# Patient Record
Sex: Female | Born: 1975 | Hispanic: No | Marital: Married | Smoking: Never smoker
Health system: Southern US, Community
[De-identification: ages and names within clinical notes are randomized; demographics above are authoritative.]

## PROBLEM LIST (undated history)

## (undated) DIAGNOSIS — R002 Palpitations: Principal | ICD-10-CM

## (undated) DIAGNOSIS — F419 Anxiety disorder, unspecified: Secondary | ICD-10-CM

## (undated) DIAGNOSIS — I341 Nonrheumatic mitral (valve) prolapse: Secondary | ICD-10-CM

## (undated) DIAGNOSIS — G373 Acute transverse myelitis in demyelinating disease of central nervous system: Secondary | ICD-10-CM

## (undated) HISTORY — DX: Palpitations: R00.2

## (undated) HISTORY — DX: Anxiety disorder, unspecified: F41.9

## (undated) HISTORY — PX: TONSILLECTOMY AND ADENOIDECTOMY: SHX28

## (undated) HISTORY — DX: Nonrheumatic mitral (valve) prolapse: I34.1

## (undated) HISTORY — DX: Acute transverse myelitis in demyelinating disease of central nervous system: G37.3

---

## 2005-02-09 ENCOUNTER — Other Ambulatory Visit: Admission: RE | Admit: 2005-02-09 | Discharge: 2005-02-09 | Payer: Self-pay | Admitting: Obstetrics and Gynecology

## 2006-03-08 ENCOUNTER — Other Ambulatory Visit: Admission: RE | Admit: 2006-03-08 | Discharge: 2006-03-08 | Payer: Self-pay | Admitting: Obstetrics and Gynecology

## 2007-04-30 ENCOUNTER — Other Ambulatory Visit: Admission: RE | Admit: 2007-04-30 | Discharge: 2007-04-30 | Payer: Self-pay | Admitting: Obstetrics and Gynecology

## 2008-07-07 ENCOUNTER — Other Ambulatory Visit: Admission: RE | Admit: 2008-07-07 | Discharge: 2008-07-07 | Payer: Self-pay | Admitting: Obstetrics and Gynecology

## 2010-11-08 ENCOUNTER — Other Ambulatory Visit: Payer: Self-pay | Admitting: Obstetrics and Gynecology

## 2010-11-08 ENCOUNTER — Other Ambulatory Visit: Payer: Self-pay | Admitting: Nurse Practitioner

## 2010-11-08 ENCOUNTER — Other Ambulatory Visit (HOSPITAL_COMMUNITY)
Admission: RE | Admit: 2010-11-08 | Discharge: 2010-11-08 | Disposition: A | Discharge status: Home / Self Care | Payer: 59 | Source: Ambulatory Visit | Attending: Obstetrics and Gynecology | Admitting: Obstetrics and Gynecology

## 2010-11-08 DIAGNOSIS — N83201 Unspecified ovarian cyst, right side: Secondary | ICD-10-CM

## 2010-11-08 DIAGNOSIS — Z1159 Encounter for screening for other viral diseases: Secondary | ICD-10-CM | POA: Insufficient documentation

## 2010-11-08 DIAGNOSIS — Z01419 Encounter for gynecological examination (general) (routine) without abnormal findings: Secondary | ICD-10-CM | POA: Insufficient documentation

## 2010-11-12 ENCOUNTER — Other Ambulatory Visit: Payer: Self-pay | Admitting: Obstetrics and Gynecology

## 2010-11-12 ENCOUNTER — Ambulatory Visit
Admission: RE | Admit: 2010-11-12 | Discharge: 2010-11-12 | Disposition: A | Discharge status: Home / Self Care | Payer: 59 | Source: Ambulatory Visit | Attending: Obstetrics and Gynecology | Admitting: Obstetrics and Gynecology

## 2010-11-12 DIAGNOSIS — N838 Other noninflammatory disorders of ovary, fallopian tube and broad ligament: Secondary | ICD-10-CM

## 2010-11-12 DIAGNOSIS — N83201 Unspecified ovarian cyst, right side: Secondary | ICD-10-CM

## 2010-11-12 MED ORDER — GADOBENATE DIMEGLUMINE 529 MG/ML IV SOLN
10.0000 mL | Freq: Once | INTRAVENOUS | Status: AC | PRN
  Administered 2010-11-12: 10 mL via INTRAVENOUS

## 2011-01-26 ENCOUNTER — Ambulatory Visit: Payer: Self-pay | Admitting: Gynecology

## 2011-02-06 ENCOUNTER — Encounter: Payer: Self-pay | Admitting: Gynecology

## 2011-02-06 ENCOUNTER — Ambulatory Visit (INDEPENDENT_AMBULATORY_CARE_PROVIDER_SITE_OTHER): Payer: BC Managed Care – PPO | Admitting: Gynecology

## 2011-02-06 ENCOUNTER — Ambulatory Visit (INDEPENDENT_AMBULATORY_CARE_PROVIDER_SITE_OTHER): Payer: BC Managed Care – PPO

## 2011-02-06 VITALS — BP 118/78 | Ht 66.75 in | Wt 136.0 lb

## 2011-02-06 DIAGNOSIS — N83209 Unspecified ovarian cyst, unspecified side: Secondary | ICD-10-CM

## 2011-02-06 DIAGNOSIS — N949 Unspecified condition associated with female genital organs and menstrual cycle: Secondary | ICD-10-CM

## 2011-02-06 DIAGNOSIS — Z309 Encounter for contraceptive management, unspecified: Secondary | ICD-10-CM

## 2011-02-06 DIAGNOSIS — N938 Other specified abnormal uterine and vaginal bleeding: Secondary | ICD-10-CM

## 2011-02-06 DIAGNOSIS — N831 Corpus luteum cyst of ovary, unspecified side: Secondary | ICD-10-CM

## 2011-02-06 LAB — POCT URINE PREGNANCY: Preg Test, Ur: NEGATIVE

## 2011-02-06 NOTE — Progress Notes (Signed)
Addended by: Cammie Mcgee T on: 02/06/2011 04:36 PM   Modules accepted: Orders

## 2011-02-06 NOTE — Patient Instructions (Signed)
Management of unscheduled bleeding in women using contraception  Authors Leland Her, MD, MPH Lonia Blood, MD, MPH Section Editor Enos Fling, MD Deputy Editor Raye Sorrow, MD Disclosures  All topics are updated as new evidence becomes available and our peer review process is complete.  Literature review current through: Nov 2012.  This topic last updated: Nov 28, 2010.  INTRODUCTION - A common reason women choose to discontinue hormonal contraception is dissatisfaction with its effects on uterine bleeding [1,2]. The bleeding pattern most bothersome to women is probably unscheduled bleeding and spotting, which can occur with all hormonal methods of contraception, as well as the copper intrauterine device. The frequency of unscheduled bleeding is highest in the first few months of use, and then begins to diminish. Interventions that prevent or treat unscheduled bleeding could improve contraceptive acceptability and increase compliance, and thus lead to fewer unplanned pregnancies. These interventions are discussed below and summarized in the figure (figure 1). DEFINITIONS - The following terminology has been suggested for standardizing the description of bleeding in women in contraceptive trials [3]: Bleeding - Blood loss that requires the use of a tampon, pad or panty liner.  Spotting - Minimal blood loss that does not require use of any type of protection.  Episode of bleeding/spotting - Bleeding/spotting days bounded on either end by two days of no bleeding or spotting.  Scheduled bleeding or withdrawal bleeding - Any bleeding or spotting that occurs during contraceptive hormone-free intervals; bleeding may continue through days 1 to 4 of the subsequent active cycle.  Unscheduled bleeding and unscheduled spotting - Any bleeding that occurs while taking active contraceptive hormones, except bleeding that begins in the hormone-free interval and continues through days 1 to 4 of the  subsequent active cycle. MECHANISM - The pathogenesis of unscheduled bleeding in women using hormonal contraception is poorly understood. Upon initiation of the method, unscheduled bleeding is thought to be due to a relatively thick endometrium transitioning to a relatively thin endometrium as a result of the progestin-dominant component of all hormonal contraceptives [1]. With continuing use, it is thought to be the final step in a complex process activated by continuous exposure to exogenous sex steroids, particularly progestins [4]. In this environment, the endometrium develops a dense network of small, thin-walled, dilated, superficial veins and capillaries, which are fragile and prone to focal bleeding. The fragility of these vessels is due to alterations in their basement membranes and pericytes, as well as reduced structural support from the endometrial stroma and glands, which are also altered [5,6]. Altered matrix metalloproteinase (MMP) acitivity appears to have a major role in this remodeling. Changes in endometrial perfusion, local vascular hemostasis, pro- and anti-oxidant processes, and migratory cells also appear to play a role. GENERAL APPROACH - Prior to initiating contraception, women should be thoroughly counseled about the range of bleeding patterns associated with various contraceptive options and informed that there is no evidence that unscheduled bleeding is associated with decreased contraceptive efficacy or other harmful effects. Providing this information as a component of standard contraception counseling helps patients choose the method that best suits their needs. If warned of the frequency, course, and significance of unscheduled bleeding, they may be more willing to adhere to the method if bleeding irregularities occur and can be reassured that the method is effective, thus reducing the risk of unintended pregnancy. Users of short-acting hormonal methods like oral contraceptive  pills, the contraceptive patch, or the vaginal ring should understand that missed or delayed ingestion, application, or placement can  lead to unscheduled withdrawal bleeding. Even with the most effective contraceptive methods, pregnancy should be considered and excluded if a woman experiences a significant change in her bleeding pattern or if she develops pregnancy-type symptoms (breast tenderness, nausea, urinary frequency, fatigue). Both pregnancy (intrauterine and ectopic) and hormonal contraceptives share many of the same signs and symptoms. In particular, absence of uterine bleeding and unscheduled bleeding are not only signs of early pregnancy, but also common occurrences with hormonal methods of birth control, both combined and progestin-only. In addition to a full medical history, the following questions can help guide further evaluation when a woman is experiencing unscheduled bleeding or spotting: What are the patient's main concerns?  What was her bleeding pattern prior to her current method of contraception? How has it changed?  How many days does she bleed each month, how heavy is the bleeding, and how many bleeding episodes occur?  Does the bleeding occur during or after sex or is it associated with pain or urinary symptoms? Bleeding associated with sex, pain, or urinary symptoms is unlikely to be related to contraception.  What drugs or medications is she taking? Some drugs may affect bleeding patterns in contraceptive users [7].  Has she been consistent in her use of contraception? Missing pills or taking them late affects hormone levels, which can cause unscheduled bleeding.  Does she smoke? Smoking may affect bleeding patterns in contraceptive users [8].  Has she had a new partner or is she at increased risk of a sexually transmitted infection? Cervicitis can cause unscheduled bleeding. (See "Acute cervicitis".)  When was her last cervical cancer screening? Beyond pregnancy testing, the  decision to initiate a more detailed evaluation will depend on the answers to these questions and the type of contraceptive the patient is using. Unscheduled bleeding is common and expected with the use of progestin methods (contraceptive implants, progestin only pills, DMPA, and the levonorgestrel-releasing IUD); further evaluation is not necessary unless indicated based on history (bleeding pattern was abnormal prior to contraceptive use), symptoms (menorrhagia, pain, vaginal discharge) or national cervical cancer screening guidelines, even if the unscheduled bleeding persists for the duration of contraceptive use. With the initiation of other contraceptive methods, including combined estrogen-progestin methods (pill, patch, ring) and the copper-releasing IUD, unscheduled bleeding is common and usually resolves over time, thus, further evaluation is not routinely necessary in the first six months of use. A patient with additional symptoms, such as pelvic pain or vaginal discharge, should have a pelvic examination. Depending on the findings and her symptoms, a pelvic ultrasound may be helpful to evaluate for possible uterine pathology. Cervical cancer screening should be performed according to national screening guidelines. (See "Screening for cervical cancer: Rationale and recommendations".) An endometrial biopsy is reasonable in women over the age of 69 who had abnormal uterine bleeding for more than three months prior to starting contraception, women with a history of endometrial hyperplasia, or women with prolonged periods of unopposed estrogen stimulation secondary to chronic anovulation. Otherwise, the decision to biopsy the endometrium for women who are experiencing unscheduled bleeding while using contraception will depend on the method they are using. (See "Initial approach to the premenopausal woman with abnormal uterine bleeding".) Drug therapy has been used in an attempt to prevent or treat  unscheduled bleeding. Most trials have been small and/or had design flaws; thus, it is difficult to determine whether the medical therapies discussed below result in a statistically significant reduction in symptoms. ESTROGEN-PROGESTIN CONTRACEPTIVES Bleeding pattern - Combined estrogen-progestin contraceptives include oral  contraceptive pills (OCs), the transdermal contraceptive patch, and the vaginal contraceptive ring. An injectable combined estrogen-progestin contraceptive (Cyclofemina, Lunelle) is available in some countries. Estrogen-progestin contraceptive pills - Unscheduled bleeding occurs in up to 30 percent of women initiating OCs, but decreases to less than 10 percent by the third month of use [1,9]. Randomized trials have shown that unscheduled bleeding is slightly higher with the lowest dose OCs (20 mcg ethinyl estradiol [EE] component) than with 30 to 35 mcg EE pills [10,11]. There is no evidence that unscheduled bleeding is associated with decreased efficacy, even with the lowest dose products, as long as the woman takes her pills consistently (ie, no missed days and at the same time every day) [1]. (See "Overview of the use of estrogen-progestin contraceptives".)  Continuous and extended administration of OCs to avoid all scheduled uterine bleeding is an increasingly popular method of contraception. Continuous use involves taking hormonally active pills indefinitely, without an induced withdrawal bleed. Extended use involves taking hormonally active pills for intervals of several months, thus minimizing scheduled bleeds to only a few times per year.  Compared to the traditional monthly regimen, continuous and extended use of OCs decreases the overall number of scheduled bleeding days; however, they are associated with a high frequency of unscheduled bleeding and spotting, particularly during the first three months of use [12-15]. In randomized trials, absence of all uterine bleeding and  spotting during months 0 to 3 was achieved in less than 50 percent of continuous OC users, but this rate increased to 80 to 90 percent by months 10 to 12 [12-14]. The duration of unscheduled bleeding appears to be higher in women using continuous OCs that contain levonorgestrel (LNG) than in those that contain norethindrone acetate [16]. (See "Hormonal contraception for suppression of menstruation".)  Patch and ring - The frequency of unscheduled bleeding upon initiation of the contraceptive patch and ring is generally similar to that with OCs. Neither is approved for extended or continuous use. Although the ring and patch are not approved for continuous/extended use, a randomized trial comparing extended versus cyclic vaginal ring regimens found that skipping the hormone-free interval increased unscheduled bleeding and reduced the amount of scheduled/withdrawal bleeding [17]. (See "Transdermal contraceptive patch".) Management - For women experiencing unscheduled bleeding while taking combined estrogen-progestin contraceptives (pill, patch, ring), reinforcing consistent use (reducing the number of missed doses, taking the pill at the same time each day) can improve the bleeding pattern [9,18]. Inconsistent pill use is associated with a 60 to 70 percent increase in the relative risk of unscheduled bleeding [18]. Smokers are more likely than nonsmokers to experience unscheduled bleeding and spotting when taking OCs [9,19]. Bleeding is also more likely to persist through subsequent cycles [9]. Smoking cessation may result in improved cycle control, and has other major health benefits [1]. Women with new or persistent bleeding for six months should be evaluated for cervicitis and other gynecological disorders associated with bleeding (eg, cervical or endometrial polyps) [9,20,21]. (See "Acute cervicitis" and "Endometrial polyps".) Women using a cyclic estrogen-progestin regimen - As with other methods, reassurance  and encouragement is the best long-term management for unscheduled bleeding associated with OC use. Unscheduled bleeding will cease by the third cycle in the majority of women [1]. If bleeding is prolonged and the woman desires intervention, experts have recommended a short course of estrogen (1.25 mg conjugated estrogen or 2 mg estradiol for seven days) to decrease the duration of the bleeding episode [1]. Supplemental estrogen can be used any time during  the pill cycle. Estrogen supplementation is thought to promote tissue repair and coagulation. However, there are no studies demonstrating the benefit of this approach. Some clinicians manage bothersome unscheduled bleeding related to OCs by switching the woman to the estrogen-progestin vaginal ring. Theoretically, this approach has two potential benefits: (1) the woman is less likely to have unscheduled bleeding related to a "missed pill," and (2) the vaginal ring is associated with more constant hormone levels than OCs. Although there are no studies evaluating this approach, the resulting elimination of wide swings in serum estrogen and progestin concentrations may reduce bothersome unscheduled bleeding. The ring has excellent cycle control under normal circumstances; however, this may not be the case in a woman experiencing unscheduled bleeding. There is no evidence that monophasic OCs are associated with less unscheduled bleeding than biphasic OCs, or that discontinuation due to dissatisfaction with bleeding patterns is different for the two formulations [9,22]. Some studies have found that triphasic OCs have a lower incidence of unscheduled bleeding than monophasic OCs [23-26]. There is also no evidence that pill products by different manufacturers or different formulations of estrogen or progestin result in improved bleeding patterns [9]. Although uncontrolled studies have reported decreased bleeding after changing formulations, bleeding would have  probably stopped with continued use of the initial OC regardless of the type of product used [1]. However, if the patient requests another OC product, allowing her to switch is unlikely to worsen bleeding patterns and can improve overall satisfaction with OC use. Patients who use a 21-day estrogen-progestin pill may have fewer unscheduled days of bleeding than women using a 24-day regimen. Two studies have addressed this topic. One reported less unscheduled bleeding in women using a 21-day compared to a 24-day hormonally active pill (mean 4.6 versus 6.1 days) [27], and the other showed no difference between the two regimens in unscheduled bleeding days [28]. Of note, the total days of bleeding (scheduled plus unscheduled) will be higher for women using a 21-day estrogen-progestin pill (mean 15.8 versus 13.2 days) [27]. Doubling or tripling the daily dose of OCs is not recommended to decrease unscheduled bleeding. While it may work for some women, it has not been studied and may increase the risk of adverse events. As discussed above, it is important to exclude gynecological disease in women with new or persistent unscheduled bleeding

## 2011-02-06 NOTE — Progress Notes (Signed)
Patient is a 35 year old gravida 0 who presented to the office as a new patient to the practice with complaint dysfunction uterine bleeding.. Patient stated she had a complete gynecological examination in early September of this year and she has always had normal Pap smears. She had an ultrasound because of some dysfunctional bleeding and she had and was informed she had a complex cyst which subsequently was followed up with an MRI. Patient November 22 of this year and had a ParaGard T380A IUD removed which it had for 2 years. She stated she has been bleeding since her last normal menstrual period November 6. When she went back to her primary physician day start her on low Estrin which she has been on 2 weeks and she's continued to bleed. She denies any pain. Monogamous relationship married and takes Lexapro for anxiety.  Exam: Abdomen: Soft nontender no rebound guarding Pelvic: Bartholin urethra Skene was within normal limits Vagina: Blood in the vaginal vault was noted but no gross lesions Cervix: No gross lesions only blood Uterus: Anteverted normal size shape and consistency nontender Adnexa: Some slight tenderness in the right adnexa Rectal exam: Not done  Ultrasound today demonstrated a uterus that measured 8.3 x 5.8 x 4.0 cm endometrial stripe of 1.4 mm. Left ovary was normal. Right ovary thinwall cyst measuring 24 x 22 x 20 mm fluid and layering of echogenic fluid positive color flow in the periphery and a small follicle 70 x 40 mm. She stated when she had her MRI they had done a CA 125 which was normal.  Urine pregnancy test today was negative. Her hemoglobin was 11.6 and hematocrit 34.7 platelet count 235,000.  We discussed various treatment options as follows: #1 continue on her newly started pill low Loestrin and give it 1-2 more months. #2 stop the oral contraceptive pill altogether use barrier contraception her on Megace 40 mg one tablet twice a day for 5 days to stop her bleeding.  Wait to her next cycle starts spontaneously and we'll switch her to Loestrin 1.5/30 oral contraceptive pill with a higher estrogenic and androgenic activity.   Patient has opted with #2 she will start in the oral contraceptive pill on the second or third day of her spontaneous cycle. She will also start iron tablet one daily and will return to the office in 3 months for followup ultrasound.  Patient instruction sheet provided all questions were answered we'll follow accordingly.

## 2011-02-15 ENCOUNTER — Telehealth: Payer: Self-pay | Admitting: *Deleted

## 2011-02-15 NOTE — Telephone Encounter (Signed)
(  02/06/11 OV )Pt called wanting to let you know that she has taken all the medication for megace and completed on 12/15 to stop bleeding. She was told to start birth control pills when period begins. For three days pt says that no symptoms of period, but when she wipes very slight blood, pt is concerned if period will start to take birth control. Please advise if pt should monitor for now.

## 2011-02-15 NOTE — Telephone Encounter (Signed)
Patient was instructed to finish her Megace and wait for her next cycle to start once she does start bleeding again she can start the oral contraceptive pill on the second or third day.

## 2011-02-16 NOTE — Telephone Encounter (Signed)
Pt informed with the below note. 

## 2011-05-02 ENCOUNTER — Ambulatory Visit
Admission: RE | Admit: 2011-05-02 | Discharge: 2011-05-02 | Disposition: A | Discharge status: Home / Self Care | Payer: Private Health Insurance - Indemnity | Source: Ambulatory Visit | Attending: Diagnostic Neuroimaging | Admitting: Diagnostic Neuroimaging

## 2011-05-02 ENCOUNTER — Other Ambulatory Visit: Payer: BC Managed Care – PPO

## 2011-05-02 ENCOUNTER — Other Ambulatory Visit: Payer: Self-pay | Admitting: Diagnostic Neuroimaging

## 2011-05-02 DIAGNOSIS — G373 Acute transverse myelitis in demyelinating disease of central nervous system: Secondary | ICD-10-CM

## 2011-05-02 MED ORDER — GADOBENATE DIMEGLUMINE 529 MG/ML IV SOLN
12.0000 mL | Freq: Once | INTRAVENOUS | Status: AC | PRN
  Administered 2011-05-02: 12 mL via INTRAVENOUS

## 2011-05-25 ENCOUNTER — Telehealth: Payer: Self-pay | Admitting: *Deleted

## 2011-05-25 MED ORDER — NORETHINDRONE ACET-ETHINYL EST 1-20 MG-MCG PO TABS: 1.0000 | ORAL_TABLET | Freq: Every day | ORAL | Status: DC

## 2011-05-25 NOTE — Telephone Encounter (Signed)
Have her finish her current oral contraceptive pill pack and then we can start her on Loestrin 1/20. Prescribed one month with 11 refills.

## 2011-05-25 NOTE — Telephone Encounter (Signed)
Pt informed, rx sent to pharmacy.

## 2011-05-25 NOTE — Telephone Encounter (Signed)
Pt is currently on Loestrin 1.5/30 oral contraceptive pill with a higher estrogenic and androgenic activity.(giving on 02/06/11)  Pt is c/o of very tender breast and pain. Pt believe it may be coming from this higher pill, pt said this is new pain and tenderness. She would like to try a lower dose pill if possible. Please advise

## 2011-06-23 ENCOUNTER — Encounter: Payer: Self-pay | Admitting: Gynecology

## 2011-06-23 ENCOUNTER — Ambulatory Visit (INDEPENDENT_AMBULATORY_CARE_PROVIDER_SITE_OTHER): Payer: Private Health Insurance - Indemnity | Admitting: Gynecology

## 2011-06-23 ENCOUNTER — Ambulatory Visit (INDEPENDENT_AMBULATORY_CARE_PROVIDER_SITE_OTHER): Payer: Managed Care, Other (non HMO)

## 2011-06-23 VITALS — BP 110/74

## 2011-06-23 DIAGNOSIS — I10 Essential (primary) hypertension: Secondary | ICD-10-CM

## 2011-06-23 DIAGNOSIS — Z309 Encounter for contraceptive management, unspecified: Secondary | ICD-10-CM

## 2011-06-23 DIAGNOSIS — L658 Other specified nonscarring hair loss: Secondary | ICD-10-CM

## 2011-06-23 DIAGNOSIS — N644 Mastodynia: Secondary | ICD-10-CM

## 2011-06-23 DIAGNOSIS — N83209 Unspecified ovarian cyst, unspecified side: Secondary | ICD-10-CM

## 2011-06-23 NOTE — Progress Notes (Signed)
Patient is a 36 year old gravida 0 who was seen the office in 02/06/2011 see previous note. Patient been complaining dysfunction bleeding. Patient stated she had a complete gynecological examination in early September of 2012 and had a normal Pap smear. She also had an ultrasound because of some dysfunctional uterine bleeding at another provider's office and was informed she had a complex cyst which was subsequently followed up with an MRI. On November 22 of that year she had a ParaGard T380A IUD removed which she had for 2 years. When she went back to that provider she was placed on Loestrin oral contraceptive pill and stated that she continued to bleed since the start of the contraceptive pill. Patient was then asked to stop her oral contraceptive pill and I placed her on Megace 40 mg twice a day for 5 days. We waited until her cycle start spontaneously and start her on a higher estrogen containing oral contraceptive pill such as Loestrin 1.5/30. She now has informed me that when she called the office because of her breast tenderness and only skin and thinning of her hair the pharmacy gave her the same high dose oral contraceptive pill. I had called in a 20 mcg oral contraceptive pill. She was having some breast tenderness today. She informed also that she was taking over-the-counter GABA daily.  Exam: Breast exam: Both breasts were examined sitting supine position both breasts are symmetrical in appearance no skin discoloration or nipple inversion on palpable masses or tenderness no supraclavicular axillary lymphadenopathy  Ultrasound: Uterus measured 8.0 x 5.6 x 4.0 cm with an endometrial stripe of 1.9 mm normal uterus and ovaries no ovarian cyst seen.  Assessment/plan: Previous ovarian cyst with evidence of complete resolution. We'll switch patient to a 20 mcg pill such as Junel 120 to continue to regulate her cycles and to see if the lower dose decrease some of the above-mentioned side effects that  she was experiencing. We also discussed about her cutting down or caffeine-containing products. 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 I have asked her to discontinue the GABA because of potential interactions with her oral contraceptive pill and may contribute to some of the side effects and she was experiencing.

## 2011-06-23 NOTE — Patient Instructions (Signed)
Breast Tenderness Breast tenderness is a common complaint made by women of all ages. It is also called mastalgia or mastodynia, which means breast pain. The condition can range from mild discomfort to severe pain. It has a variety of causes. Your caregiver will find out the likely cause of your breast tenderness by examining your breasts, asking you about symptoms and perhaps ordering some tests. Breast tenderness usually does not mean you have breast cancer. CAUSES  Breast tenderness has many possible causes. They include:  Premenstrual changes. A week to 10 days before your period, your breasts might ache or feel tender.   Other hormonal causes. These include:   When sexual and physical traits mature (puberty).   Pregnancy.   The time right before and the year after menopause (perimenopause).   The day when it has been 12 months since your last period (menopause).   Large breasts.   Infection (also called mastitis).   Birth control pills.   Breastfeeding. Tenderness can occur if the breasts are overfull with milk or if a milk duct is blocked.   Injury.   Fibrocystic breast changes. This is not cancer (benign). It causes painful breasts that feel lumpy.   Fluid-filled sacs (cysts). Often cysts can be drained in your healthcare provider's office.   Fibroadenoma. This is a tumor that is not cancerous.   Medication side effects. Blood pressure drugs and diuretics (which increase urine flow) sometimes cause breast tenderness.   Previous breast surgery, such as a breast reduction.   Breast cancer. Cancer is rarely the reason breasts are tender. In most women, tenderness is caused by something else.  DIAGNOSIS  Several methods can be used to find out why your breasts are tender. They include:  Visual inspection of the breasts.   Examination by hand.   Tests, such as:   Mammogram.   Ultrasound.   Biopsy.   Lab test of any fluid coming from the nipple.   Blood tests.     MRI.  TREATMENT  Treatment is directed to the cause of the breast tenderness from doing nothing for minor discomfort, wearing a good support bra but also may include:  Taking over-the-counter medicines for pain or discomfort as directed by your caregiver.   Prescription medicine for breast tenderness related to:   Premenstrual.   Fibrocystic.   Puberty.   Pregnancy.   Menopause.   Previous breast surgery.   Large breasts.   Antibiotics for infection.   Birth control pills for fibrocystic and premenstrual changes.   More frequent feedings or pumping of the breasts and warm compresses for breast engorgement when nursing.   Cold and warm compresses and a good support bra for most breast injuries.   Breast cysts are sometimes drained with a needle (aspiration) or removed with minor surgery.   Fibroadenomas are usually removed with minor surgery.   Changing or stopping the medicine when it is responsible for causing the breast tenderness.   When breast cancer is present with or without causing pain, it is usually treated with major surgery (with or without radiation) and chemotherapy.  HOME CARE INSTRUCTIONS  Breast tenderness often can be handled at home. You can try:  Getting fitted for a new bra that provides more support, especially during exercise.   Wearing a more supportive or sports bra while sleeping when your breasts are very tender.   If you have a breast injury, using an ice pack for 15 to 20 minutes. Wrap the pack in a   towel. Do not put the ice pack directly on your breast.   If your breasts are too full of milk as a result of breastfeeding, try:   Expressing milk either by hand or with a breast pump.   Applying a warm compress for relief.   Taking over-the-counter pain relievers, if this is OK with your caregiver.   Taking medicine that your caregiver prescribes. These might include antibiotics or birth control pills.  Over the long term, your  breast tenderness might be eased if you:  Cut down on caffeine.   Reduce the amount of fat in your diet.  Also, learn how to do breast examinations at home. This will help you tell when you have an unusual growth or lump that could cause tenderness. And keep a log of the days and times when your breasts are most tender. This will help you and your caregiver find the right solution. SEEK MEDICAL CARE IF:   Any part of your breast is hard, red and hot to the touch. This could be a sign of infection.   Fluid is coming out of your nipples (and you are not breastfeeding). Especially watch for blood or pus.   You have a fever as well as breast tenderness.   You have a new or painful lump in your breast that remains after your period ends.   You have tried to take care of the pain at home, but it has not gone away.   Your breast pain is getting worse. Or, the pain is making it hard to do the things you usually do during your day.  Document Released: 01/27/2008 Document Revised: 02/02/2011 Document Reviewed: 01/27/2008 [employer] Patient Information 2012 [employer]. 

## 2011-09-12 ENCOUNTER — Telehealth: Payer: Self-pay | Admitting: *Deleted

## 2011-09-12 NOTE — Telephone Encounter (Signed)
Pt is having normal cycles since starting Junel 1/20, she doesn't want to be placed back on Loestrin 1.5/30 because it caused breast tenderness as noted in 06/23/11 office visit. Pt asked if there is a even lower dose than the junel 1/20? If so she would like to start that, and was told to call back in 3 months so this can be done. Please advise

## 2011-09-12 NOTE — Telephone Encounter (Signed)
You can have patient come by the office and we can give her a sample of low low Estrin for her to try and we can call in a prescription for her. Have her started on the second day of her menses

## 2011-09-12 NOTE — Telephone Encounter (Signed)
Pt is calling to follow up with OV 06/23/11 regarding birth control pills, pt said she was told to call back in 3 months after starting Junel 1/20 pills and you would prescribed a low dose pill to her?  Didn't see this in office note, pt is not not having any breast tenderness or other symptoms. Please advise

## 2011-09-12 NOTE — Telephone Encounter (Signed)
My notes stated that she would be placed on Loestrin 1.5/30. If she is back not having normal menstrual cycles she can start this birth control pill on the second day of her upcoming menstrual cycle. Please prescribe one pack with 11 refills

## 2011-09-12 NOTE — Telephone Encounter (Signed)
Pt informed with the below note, left samples up front for pick up.

## 2011-09-22 ENCOUNTER — Telehealth: Payer: Self-pay | Admitting: *Deleted

## 2011-09-22 NOTE — Telephone Encounter (Signed)
FOLLOW UP TELEPHONE CONVERSATION 09/12/11 PT WAS TOLD TO START NEW PILL LO LOESTRIN ON SECOND DAY OF CYCLE. PT STOPPED TAKING HER OLD BIRTH CONTROL PILL JUNEL 1/20 ON Monday AND NO CYCLE YET. SHE DOESN'T BELIEVE SHE COULD BE PREGNANT, PT HAS USED DOUBLE PROTECTION. SHOULD SHE START ON LO LOESTRIN AS DIRECTED OR WAIT TO SEE IF CYCLE STARTS? PLEASE ADVISE

## 2011-09-22 NOTE — Telephone Encounter (Signed)
Pt informed with the below note, she take a UPT to check for pregnancy.

## 2011-09-22 NOTE — Telephone Encounter (Signed)
I would wait for her to start her cycle before she starts the new pack of pills if not she would have to deal with breakthrough bleeding. We also need to make sure that she's not pregnant as well.

## 2011-09-29 ENCOUNTER — Telehealth: Payer: Self-pay | Admitting: *Deleted

## 2011-09-29 NOTE — Telephone Encounter (Signed)
Pt calling to f/u with telephone encounter 09/22/11. Cycle has not yet started to take birth control pills, pt did take UPT it was negative. She did note last Sunday very light pink spotting but never a flow, no cycle in 1 month. ZOX:WRUE She would like to know what is next step. Pt will wait over the weekend to see if cycle starts if no I will send note back to JF once her returns back to office on Monday.

## 2011-10-02 MED ORDER — MEDROXYPROGESTERONE ACETATE 10 MG PO TABS: ORAL_TABLET | ORAL | Status: DC

## 2011-10-02 NOTE — Telephone Encounter (Signed)
If cycle does not start in a week do home UPT and if negative take Provera 10 mg one daily for 5-10 days to intiate cycle. Once she starts bleeding she can stop provera and start OCP on second day of cycle.

## 2011-10-02 NOTE — Telephone Encounter (Signed)
Please see below, pt called this am and no cycle yet? Please advise

## 2011-10-02 NOTE — Telephone Encounter (Signed)
Pt informed with all the below note, rx sent to pharmacy. 

## 2011-11-22 ENCOUNTER — Other Ambulatory Visit (HOSPITAL_COMMUNITY)
Admission: RE | Admit: 2011-11-22 | Discharge: 2011-11-22 | Disposition: A | Discharge status: Home / Self Care | Payer: Managed Care, Other (non HMO) | Source: Ambulatory Visit | Attending: Gynecology | Admitting: Gynecology

## 2011-11-22 ENCOUNTER — Ambulatory Visit (INDEPENDENT_AMBULATORY_CARE_PROVIDER_SITE_OTHER): Payer: Managed Care, Other (non HMO) | Admitting: Gynecology

## 2011-11-22 ENCOUNTER — Encounter: Payer: Self-pay | Admitting: Gynecology

## 2011-11-22 VITALS — BP 110/74 | Ht 66.0 in | Wt 138.0 lb

## 2011-11-22 DIAGNOSIS — Z01419 Encounter for gynecological examination (general) (routine) without abnormal findings: Secondary | ICD-10-CM

## 2011-11-22 DIAGNOSIS — F411 Generalized anxiety disorder: Secondary | ICD-10-CM

## 2011-11-22 DIAGNOSIS — F419 Anxiety disorder, unspecified: Secondary | ICD-10-CM

## 2011-11-22 LAB — CBC WITH DIFFERENTIAL/PLATELET
Basophils Absolute: 0 10*3/uL (ref 0.0–0.1)
Eosinophils Relative: 1 % (ref 0–5)
Lymphocytes Relative: 33 % (ref 12–46)
Neutro Abs: 3.7 10*3/uL (ref 1.7–7.7)
Neutrophils Relative %: 62 % (ref 43–77)
Platelets: 229 10*3/uL (ref 150–400)
RDW: 12.8 % (ref 11.5–15.5)
WBC: 6 10*3/uL (ref 4.0–10.5)

## 2011-11-22 LAB — CHOLESTEROL, TOTAL: Cholesterol: 190 mg/dL (ref 0–200)

## 2011-11-22 LAB — GLUCOSE, RANDOM: Glucose, Bld: 106 mg/dL — ABNORMAL HIGH (ref 70–99)

## 2011-11-22 MED ORDER — NORETHIN ACE-ETH ESTRAD-FE 1-20 MG-MCG PO TABS: 1.0000 | ORAL_TABLET | Freq: Every day | ORAL | Status: DC

## 2011-11-22 MED ORDER — ALPRAZOLAM 0.25 MG PO TABS: ORAL_TABLET | ORAL | Status: DC

## 2011-11-22 NOTE — Patient Instructions (Addendum)

## 2011-11-22 NOTE — Progress Notes (Signed)
Leslie Ford 1975/07/18 [id number]   History:    36 y.o.  for annual gyn exam who is been underlying distress and has been complaining of anxiety. She is seeing a therapist although currently on no medication. She previously was on SSRI and discontinued. She was concerned also that her periods are very light lasting one to 2 days but she is on Loestrin oral contraceptive pills and is having 1 monthly cycle. She frequently does her self breast examination. She has declined a dTap Vaccine because of her history of transverse myelitis.  Past medical history,surgical history, family history and social history were all reviewed and documented in the EPIC chart.  Gynecologic History Patient's last menstrual period was 11/06/2011. Contraception: OCP (estrogen/progesterone) Last Pap: 2012. Results were: normal Last mammogram: Not indicated. Results were: Not indicated  Obstetric History OB History    Grav Para Term Preterm Abortions TAB SAB Ect Mult Living   0                ROS: A ROS was performed and pertinent positives and negatives are included in the history.  GENERAL: No fevers or chills. HEENT: No change in vision, no earache, sore throat or sinus congestion. NECK: No pain or stiffness. CARDIOVASCULAR: No chest pain or pressure. No palpitations. PULMONARY: No shortness of breath, cough or wheeze. GASTROINTESTINAL: No abdominal pain, nausea, vomiting or diarrhea, melena or bright red blood per rectum. GENITOURINARY: No urinary frequency, urgency, hesitancy or dysuria. MUSCULOSKELETAL: No joint or muscle pain, no back pain, no recent trauma. DERMATOLOGIC: No rash, no itching, no lesions. ENDOCRINE: No polyuria, polydipsia, no heat or cold intolerance. No recent change in weight. HEMATOLOGICAL: No anemia or easy bruising or bleeding. NEUROLOGIC: No headache, seizures, numbness, tingling or weakness. PSYCHIATRIC: No depression, no loss of interest in normal activity or change in sleep pattern.       Exam: chaperone present  BP 110/74  Ht 5\' 6"  (1.676 m)  Wt 138 lb (62.596 kg)  BMI 22.27 kg/m2  LMP 11/06/2011  Body mass index is 22.27 kg/(m^2).  General appearance : Well developed well nourished female. No acute distress HEENT: Neck supple, trachea midline, no carotid bruits, no thyroidmegaly Lungs: Clear to auscultation, no rhonchi or wheezes, or rib retractions  Heart: Regular rate and rhythm, no murmurs or gallops Breast:Examined in sitting and supine position were symmetrical in appearance, no palpable masses or tenderness,  no skin retraction, no nipple inversion, no nipple discharge, no skin discoloration, no axillary or supraclavicular lymphadenopathy Abdomen: no palpable masses or tenderness, no rebound or guarding Extremities: no edema or skin discoloration or tenderness  Pelvic:  Bartholin, Urethra, Skene Glands: Within normal limits             Vagina: No gross lesions or discharge  Cervix: No gross lesions or discharge  Uterus  anteverted, normal size, shape and consistency, non-tender and mobile  Adnexa  Without masses or tenderness  Anus and perineum  normal   Rectovaginal  normal sphincter tone without palpated masses or tenderness             Hemoccult not indicated     Assessment/Plan:  36 y.o. with normal annual GYN exam. We discussed the new Pap smear screening guidelines and I explained to her that she will not need one for 2 more years but she refused and wanted to have one yearly even if she had to pay for it. The following labs were ordered: CBC, cholesterol, TSH, random blood sugar  alone with her urinalysis. For her anxiety I have prescribed Xanax 0.25 mg to take 1 by mouth daily when necessary. She was encouraged to continue to do her monthly self breast examination. she has to pay for it. Prescription refill for low Estrin was provided as well.    Ok Edwards MD, 1:52 PM 11/22/2011

## 2011-11-22 NOTE — Addendum Note (Signed)
Addended by: Bertram Savin A on: 11/22/2011 02:01 PM   Modules accepted: Orders

## 2011-11-23 LAB — URINALYSIS W MICROSCOPIC + REFLEX CULTURE
Bacteria, UA: NONE SEEN
Casts: NONE SEEN
Hgb urine dipstick: NEGATIVE
Ketones, ur: NEGATIVE mg/dL
Nitrite: NEGATIVE
pH: 6.5 (ref 5.0–8.0)

## 2011-12-01 ENCOUNTER — Telehealth: Payer: Self-pay | Admitting: *Deleted

## 2011-12-01 NOTE — Telephone Encounter (Signed)
Pt informed with lab result on 11/22/11 OV.

## 2012-01-03 ENCOUNTER — Ambulatory Visit (INDEPENDENT_AMBULATORY_CARE_PROVIDER_SITE_OTHER): Payer: Managed Care, Other (non HMO) | Admitting: Gynecology

## 2012-01-03 ENCOUNTER — Encounter: Payer: Self-pay | Admitting: Gynecology

## 2012-01-03 VITALS — BP 118/74

## 2012-01-03 DIAGNOSIS — N921 Excessive and frequent menstruation with irregular cycle: Secondary | ICD-10-CM

## 2012-01-03 MED ORDER — NORGESTIM-ETH ESTRAD TRIPHASIC 0.18/0.215/0.25 MG-25 MCG PO TABS: 1.0000 | ORAL_TABLET | Freq: Every day | ORAL | Status: DC

## 2012-01-03 NOTE — Progress Notes (Signed)
Patient presented to the office today to discuss changing her oral contraceptive pill. She states she doesn't like that some times she doesn't have a period and then sometimes it surprises her when her menses start. She is currently on low low Estrin. She states that she has had good compliance. We discussed about switching her to a 20 mcg triphasic such as Ortho Tri-Cyclen Lo which is low estrogen and low progestin as well as low androgenic activity which would be a good choice for her to minimize spotting and breakthrough bleeding and any androgenic affect. We discussed also that this would have a more favorable lipid profile as well. She was instructed to finish her current oral contraceptive pills then start a new pack. She had a normal gynecological examination and labs recently see previous note. Literature information was provided.

## 2012-01-03 NOTE — Patient Instructions (Addendum)
Oral Contraception Use Oral contraceptives (OCs) are medicines taken to prevent pregnancy. OCs work by preventing the ovaries from releasing eggs. The hormones in OCs also cause the cervical mucus to thicken, preventing the sperm from entering the uterus. The hormones also cause the uterine lining to become thin, not allowing a fertilized egg to attach to the inside of the uterus. OCs are highly effective when taken exactly as prescribed. However, OCs do not prevent sexually transmitted diseases (STDs). Safe sex practices, such as using condoms along with an OC, can help prevent STDs.  Before taking OCs, you may have a physical exam and Pap test. Your caregiver may also order blood tests if necessary. Your caregiver will make sure you are a good candidate for oral contraception. Discuss with your caregiver the possible side effects of the OC you may be prescribed. When starting an OC, it can take 2 to 3 months for the body to adjust to the changes in hormone levels in your body.  HOW TO TAKE ORAL CONTRACEPTIVES Your caregiver may advise you on how to start taking the first cycle of OCs. Otherwise, you can:  Start on day 1 of your menstrual period. You will not need any backup contraceptive protection with this start time.  Start on the first Sunday after your menstrual period or the day you get your prescription. In these cases, you will need to use backup contraceptive protection for the first 7-day cycle. After you have started taking OCs:  If you forget to take 1 pill, take it as soon as you remember. Take the next pill at the regular time.  If you miss 2 or more pills, use backup birth control until your next menstrual period starts.  If you use a 28-day pack that contains inactive pills and you miss 1 of the last 7 pills (pills with no hormones), it will not matter. Throw away the rest of the non-hormone pills and start a new pill pack. No matter which day you start the OC, you will always start  a new pack on that same day of the week. Have an extra pack of OCs and a backup contraceptive method available in case you miss some pills or lose your OC pack. HOME CARE INSTRUCTIONS   Do not smoke.  Always use a condom to protect against STDs. OCs do not protect against STDs.  Use a calendar to mark your menstrual period days.  Read the information and directions that come with your OC. Talk to your caregiver if you have questions. SEEK MEDICAL CARE IF:   You develop nausea and vomiting.  You have abnormal vaginal discharge or bleeding.  You develop a rash.  You miss your menstrual period.  You are losing your hair.  You need treatment for mood swings or depression.  You get dizzy when taking the OC.  You develop acne from taking the OC.  You become pregnant. SEEK IMMEDIATE MEDICAL CARE IF:   You develop chest pain.  You develop shortness of breath.  You have an uncontrolled or severe headache.  You develop numbness or slurred speech.  You develop visual problems.  You develop pain, redness, and swelling in the legs. Document Released: 02/02/2011 Document Revised: 05/08/2011 Document Reviewed: 02/02/2011 [employer] Patient Information 2013 [employer].  

## 2012-04-13 ENCOUNTER — Other Ambulatory Visit: Payer: Self-pay

## 2012-05-30 ENCOUNTER — Ambulatory Visit (INDEPENDENT_AMBULATORY_CARE_PROVIDER_SITE_OTHER): Payer: Managed Care, Other (non HMO) | Admitting: Gynecology

## 2012-05-30 ENCOUNTER — Encounter: Payer: Self-pay | Admitting: Gynecology

## 2012-05-30 VITALS — BP 102/78

## 2012-05-30 DIAGNOSIS — Z862 Personal history of diseases of the blood and blood-forming organs and certain disorders involving the immune mechanism: Secondary | ICD-10-CM

## 2012-05-30 DIAGNOSIS — Z8742 Personal history of other diseases of the female genital tract: Secondary | ICD-10-CM

## 2012-05-30 DIAGNOSIS — N921 Excessive and frequent menstruation with irregular cycle: Secondary | ICD-10-CM

## 2012-05-30 LAB — CBC WITH DIFFERENTIAL/PLATELET
Basophils Absolute: 0 10*3/uL (ref 0.0–0.1)
Basophils Relative: 0 % (ref 0–1)
Hemoglobin: 11.4 g/dL — ABNORMAL LOW (ref 12.0–15.0)
MCHC: 33.6 g/dL (ref 30.0–36.0)
Monocytes Relative: 4 % (ref 3–12)
Neutro Abs: 2.7 10*3/uL (ref 1.7–7.7)
Neutrophils Relative %: 65 % (ref 43–77)
Platelets: 205 10*3/uL (ref 150–400)
RDW: 13.3 % (ref 11.5–15.5)

## 2012-05-30 LAB — PREGNANCY, URINE: Preg Test, Ur: NEGATIVE

## 2012-05-30 MED ORDER — LEVONORGESTREL-ETHINYL ESTRAD 0.1-20 MG-MCG PO TABS: 1.0000 | ORAL_TABLET | Freq: Every day | ORAL | Status: DC

## 2012-05-30 MED ORDER — LEVONORGESTREL-ETHINYL ESTRAD 0.1-20 MG-MCG PO TABS: ORAL_TABLET | ORAL | Status: DC

## 2012-05-30 NOTE — Progress Notes (Signed)
Patient presented to the office today as a result of dysfunction uterine bleeding while on the oral contraceptive pill. Patient been seen the office on November 6 of 2013 but the concern that she would not have any menstrual cycles while she was on the low low Estrin oral contraceptive pill and wanted to have a cycle every month. She was switched to Ortho Tri-Cyclen low which was low estrogen and low progestin as well as low androgenic activity which would be a good choice for her to minimize spotting and breakthrough bleeding and any androgenic affect. We discussed also that this would have a more favorable lipid profile as well. She stated that the previous cycle she forgot to take a pill and then started a few days later and then had bled for 13 days and started a new pack 10 days before the pack was to be completed. She stated that today was sedated she did not have any vaginal bleeding.  Exam: Bartholin urethra Skene was within normal limits Vagina: Some dark menstrual blood present Cervix: No lesions or discharge no active bleeding Uterus: Anteverted normal size shape and consistency Adnexa: No palpable masses or tenderness Rectal exam: Not done  Assessment/plan: Patient with dysfunction uterine bleeding probably attributed to compliance although patient would like to change the oral contraceptive pill we will place her now on a 25 mcg oral contraceptive pill of Estinyl estradiol. She was started with her next cycle. We discussed importance of compliance. We will check a urine pregnancy test today as well as a CBC. If she has any further irregular bleeding over the course of the next 3 months despite being compliant we discussed that we would need to proceed then with a sonohysterogram to rule out any intrauterine abnormality.

## 2012-11-04 ENCOUNTER — Encounter: Payer: Self-pay | Admitting: Gynecology

## 2012-11-04 ENCOUNTER — Ambulatory Visit (INDEPENDENT_AMBULATORY_CARE_PROVIDER_SITE_OTHER): Payer: Managed Care, Other (non HMO) | Admitting: Gynecology

## 2012-11-04 VITALS — BP 120/78

## 2012-11-04 DIAGNOSIS — N921 Excessive and frequent menstruation with irregular cycle: Secondary | ICD-10-CM

## 2012-11-04 NOTE — Patient Instructions (Signed)
Oral Contraception Use  Oral contraceptives (OCs) are medicines taken to prevent pregnancy. OCs work by preventing the ovaries from releasing eggs. The hormones in OCs also cause the cervical mucus to thicken, preventing the sperm from entering the uterus. The hormones also cause the uterine lining to become thin, not allowing a fertilized egg to attach to the inside of the uterus. OCs are highly effective when taken exactly as prescribed. However, OCs do not prevent sexually transmitted diseases (STDs). Safe sex practices, such as using condoms along with an OC, can help prevent STDs.   Before taking OCs, you may have a physical exam and Pap test. Your caregiver may also order blood tests if necessary. Your caregiver will make sure you are a good candidate for oral contraception. Discuss with your caregiver the possible side effects of the OC you may be prescribed. When starting an OC, it can take 2 to 3 months for the body to adjust to the changes in hormone levels in your body.   HOW TO TAKE ORAL CONTRACEPTIVES  Your caregiver may advise you on how to start taking the first cycle of OCs. Otherwise, you can:  · Start on day 1 of your menstrual period. You will not need any backup contraceptive protection with this start time.  · Start on the first Sunday after your menstrual period or the day you get your prescription. In these cases, you will need to use backup contraceptive protection for the first 7-day cycle.  After you have started taking OCs:  · If you forget to take 1 pill, take it as soon as you remember. Take the next pill at the regular time.  · If you miss 2 or more pills, use backup birth control until your next menstrual period starts.  · If you use a 28-day pack that contains inactive pills and you miss 1 of the last 7 pills (pills with no hormones), it will not matter. Throw away the rest of the non-hormone pills and start a new pill pack.  No matter which day you start the OC, you will always start  a new pack on that same day of the week. Have an extra pack of OCs and a backup contraceptive method available in case you miss some pills or lose your OC pack.  HOME CARE INSTRUCTIONS   · Do not smoke.  · Always use a condom to protect against STDs. OCs do not protect against STDs.  · Use a calendar to mark your menstrual period days.  · Read the information and directions that come with your OC. Talk to your caregiver if you have questions.  SEEK MEDICAL CARE IF:   · You develop nausea and vomiting.  · You have abnormal vaginal discharge or bleeding.  · You develop a rash.  · You miss your menstrual period.  · You are losing your hair.  · You need treatment for mood swings or depression.  · You get dizzy when taking the OC.  · You develop acne from taking the OC.  · You become pregnant.  SEEK IMMEDIATE MEDICAL CARE IF:   · You develop chest pain.  · You develop shortness of breath.  · You have an uncontrolled or severe headache.  · You develop numbness or slurred speech.  · You develop visual problems.  · You develop pain, redness, and swelling in the legs.  Document Released: 02/02/2011 Document Revised: 05/08/2011 Document Reviewed: 02/02/2011  [employer]® Patient Information ©2014 [employer].

## 2012-11-04 NOTE — Progress Notes (Signed)
Patient presented to the office today as a result of her recent episode of dysfunction uterine bleeding on oral contraceptive pill. She is currently on Orsythia which she started in April of this year and then had done well. She states that she likes to spill. Her cycle had been regular. She was taken on a regular basis and withdrawn monthly. Since she was going to be she decided to take the pill consecutively for 2 cycles but then went back to 1 pack per month and had breakthrough bleeding that lasted about 12 days but mostly brownish  and not bright red. She denied any cramping, nausea, or vomiting.    Exam: Bartholin urethra Skene was within normal limits Vagina: No lesions or discharge Cervix no lesions or discharge  Uterus: Anteverted normal size shape and consistency Adnexa: No palpable mass or tenderness Rectal exam: Not not  Assessment/plan: Breakthrough bleeding on or contraceptive pill attributed patient changing sequence of medication. She was instructed to stay the course either to withdrawal every 3 months or to withdrawal monthly. Switching back and forth may have contributed to this breakthrough bleeding. She stated she will go to the once a month and bleeding cycles because it will be easier for her to remember. She is scheduled to return back next month for her annual exam.

## 2012-11-29 ENCOUNTER — Encounter: Payer: Self-pay | Admitting: Gynecology

## 2012-11-29 ENCOUNTER — Ambulatory Visit (INDEPENDENT_AMBULATORY_CARE_PROVIDER_SITE_OTHER): Payer: Managed Care, Other (non HMO) | Admitting: Gynecology

## 2012-11-29 VITALS — BP 120/86 | Ht 67.0 in | Wt 141.6 lb

## 2012-11-29 DIAGNOSIS — Z01411 Encounter for gynecological examination (general) (routine) with abnormal findings: Secondary | ICD-10-CM

## 2012-11-29 DIAGNOSIS — J019 Acute sinusitis, unspecified: Secondary | ICD-10-CM

## 2012-11-29 DIAGNOSIS — Z01419 Encounter for gynecological examination (general) (routine) without abnormal findings: Secondary | ICD-10-CM

## 2012-11-29 DIAGNOSIS — R9389 Abnormal findings on diagnostic imaging of other specified body structures: Secondary | ICD-10-CM

## 2012-11-29 LAB — CBC WITH DIFFERENTIAL/PLATELET
Basophils Absolute: 0 10*3/uL (ref 0.0–0.1)
Lymphocytes Relative: 23 % (ref 12–46)
Lymphs Abs: 2 10*3/uL (ref 0.7–4.0)
Neutro Abs: 6.1 10*3/uL (ref 1.7–7.7)
Neutrophils Relative %: 71 % (ref 43–77)
Platelets: 220 10*3/uL (ref 150–400)
RBC: 4.01 MIL/uL (ref 3.87–5.11)
WBC: 8.7 10*3/uL (ref 4.0–10.5)

## 2012-11-29 LAB — COMPREHENSIVE METABOLIC PANEL
ALT: 9 U/L (ref 0–35)
AST: 9 U/L (ref 0–37)
CO2: 26 mEq/L (ref 19–32)
Calcium: 9.7 mg/dL (ref 8.4–10.5)
Chloride: 104 mEq/L (ref 96–112)
Sodium: 137 mEq/L (ref 135–145)
Total Bilirubin: 0.5 mg/dL (ref 0.3–1.2)
Total Protein: 6.8 g/dL (ref 6.0–8.3)

## 2012-11-29 LAB — LIPID PANEL
Cholesterol: 165 mg/dL (ref 0–200)
VLDL: 17 mg/dL (ref 0–40)

## 2012-11-29 LAB — TSH: TSH: 1.398 u[IU]/mL (ref 0.350–4.500)

## 2012-11-29 MED ORDER — CEFUROXIME AXETIL 500 MG PO TABS: 500.0000 mg | ORAL_TABLET | Freq: Two times a day (BID) | ORAL | Status: DC

## 2012-11-29 MED ORDER — LEVONORGESTREL-ETHINYL ESTRAD 0.1-20 MG-MCG PO TABS: ORAL_TABLET | ORAL | Status: DC

## 2012-11-29 MED ORDER — ESCITALOPRAM OXALATE 10 MG PO TABS: 20.0000 mg | ORAL_TABLET | Freq: Every day | ORAL | Status: DC

## 2012-11-29 NOTE — Progress Notes (Signed)
Leslie Ford 02-May-1975 [id number]   History:    37 y.o.  for annual gyn exam who was requesting prescription refill for Lexapro 10 mg which her primary physician had prescribed in the past for her anxiety and it has worked well. Patient also complaining of tender frontal sinuses and postnasal drip. Patient with no prior history of abnormal Pap smear. She would like to hold off on the flu vaccine because at the age of 74 she had transverse myelitis. Patient with regular cycles on oral contraceptive pill.  Past medical history,surgical history, family history and social history were all reviewed and documented in the EPIC chart.  Gynecologic History Patient's last menstrual period was 11/11/2012. Contraception: oral progesterone-only contraceptive Last Pap: 2013. Results were: normal Last mammogram: none indicated. Results were: none indicated  Obstetric History OB History  Gravida Para Term Preterm AB SAB TAB Ectopic Multiple Living  0                  ROS: A ROS was performed and pertinent positives and negatives are included in the history.  GENERAL: No fevers or chills. HEENT:postnasal drip and tender sinuses sore throat or sinus congestion. NECK: No pain or stiffness. CARDIOVASCULAR: No chest pain or pressure. No palpitations. PULMONARY: No shortness of breath, cough or wheeze. GASTROINTESTINAL: No abdominal pain, nausea, vomiting or diarrhea, melena or bright red blood per rectum. GENITOURINARY: No urinary frequency, urgency, hesitancy or dysuria. MUSCULOSKELETAL: No joint or muscle pain, no back pain, no recent trauma. DERMATOLOGIC: No rash, no itching, no lesions. ENDOCRINE: No polyuria, polydipsia, no heat or cold intolerance. No recent change in weight. HEMATOLOGICAL: No anemia or easy bruising or bleeding. NEUROLOGIC: No headache, seizures, numbness, tingling or weakness. PSYCHIATRIC: No depression, no loss of interest in normal activity or change in sleep pattern.      Exam: chaperone present  BP 120/86  Ht 5\' 7"  (1.702 m)  Wt 141 lb 9.6 oz (64.229 kg)  BMI 22.17 kg/m2  LMP 11/11/2012  Body mass index is 22.17 kg/(m^2).  General appearance : Well developed well nourished female. No acute distress HEENT: Neck supple, trachea midline, no carotid bruits, no thyroidmegaly,tender frontal and maxillary sinuses Lungs: Clear to auscultation, no rhonchi or wheezes, or rib retractions  Heart: Regular rate and rhythm, no murmurs or gallops Breast:Examined in sitting and supine position were symmetrical in appearance, no palpable masses or tenderness,  no skin retraction, no nipple inversion, no nipple discharge, no skin discoloration, no axillary or supraclavicular lymphadenopathy Abdomen: no palpable masses or tenderness, no rebound or guarding Extremities: no edema or skin discoloration or tenderness  Pelvic:  Bartholin, Urethra, Skene Glands: Within normal limits             Vagina: No gross lesions or discharge  Cervix: No gross lesions or discharge  Uterus  Retroverted irregularity noted at the fundus back of uterus nontender,  Adnexa  Without masses or tenderness  Anus and perineum  normal   Rectovaginal  normal sphincter tone without palpated masses or tenderness             Hemoccult none indicated     Assessment/Plan:  37 y.o. female for annual exam will return to the office in the next several weeks for an ultrasound. At time of pelvic examination she was noted to have a retroverted uterus and irregularity at the fundus posterior of the uterus. Possible fibroid?. Last year patient had a normal ultrasound. Rectovaginal exam did not demonstrating any stool in  the area. The following labs were ordered: CBC, comprehensive metabolic panel, TSH, fasting lipid profile and urinalysis. Pap smear not done today in accordance to the new guidelines. Patient with acute sinusitis we treated with Ceftin 500 mg one by mouth twice a day for 7 days. All  Note:  This dictation was prepared with  Dragon/digital dictation along withSmart phrase technology. Any transcriptional errors that result from this process are unintentional.   Ok Edwards MD, 9:47 AM 11/29/2012

## 2012-11-29 NOTE — Patient Instructions (Addendum)
Sinusitis Sinusitis is redness, soreness, and swelling (inflammation) of the paranasal sinuses. Paranasal sinuses are air pockets within the bones of your face (beneath the eyes, the middle of the forehead, or above the eyes). In healthy paranasal sinuses, mucus is able to drain out, and air is able to circulate through them by way of your nose. However, when your paranasal sinuses are inflamed, mucus and air can become trapped. This can allow bacteria and other germs to grow and cause infection. Sinusitis can develop quickly and last only a short time (acute) or continue over a long period (chronic). Sinusitis that lasts for more than 12 weeks is considered chronic.  CAUSES  Causes of sinusitis include:  Allergies.  Structural abnormalities, such as displacement of the cartilage that separates your nostrils (deviated septum), which can decrease the air flow through your nose and sinuses and affect sinus drainage.  Functional abnormalities, such as when the small hairs (cilia) that line your sinuses and help remove mucus do not work properly or are not present. SYMPTOMS  Symptoms of acute and chronic sinusitis are the same. The primary symptoms are pain and pressure around the affected sinuses. Other symptoms include:  Upper toothache.  Earache.  Headache.  Bad breath.  Decreased sense of smell and taste.  A cough, which worsens when you are lying flat.  Fatigue.  Fever.  Thick drainage from your nose, which often is green and may contain pus (purulent).  Swelling and warmth over the affected sinuses. DIAGNOSIS  Your caregiver will perform a physical exam. During the exam, your caregiver may:  Look in your nose for signs of abnormal growths in your nostrils (nasal polyps).  Tap over the affected sinus to check for signs of infection.  View the inside of your sinuses (endoscopy) with a special imaging device with a light attached (endoscope), which is inserted into your  sinuses. If your caregiver suspects that you have chronic sinusitis, one or more of the following tests may be recommended:  Allergy tests.  Nasal culture A sample of mucus is taken from your nose and sent to a lab and screened for bacteria.  Nasal cytology A sample of mucus is taken from your nose and examined by your caregiver to determine if your sinusitis is related to an allergy. TREATMENT  Most cases of acute sinusitis are related to a viral infection and will resolve on their own within 10 days. Sometimes medicines are prescribed to help relieve symptoms (pain medicine, decongestants, nasal steroid sprays, or saline sprays).  However, for sinusitis related to a bacterial infection, your caregiver will prescribe antibiotic medicines. These are medicines that will help kill the bacteria causing the infection.  Rarely, sinusitis is caused by a fungal infection. In theses cases, your caregiver will prescribe antifungal medicine. For some cases of chronic sinusitis, surgery is needed. Generally, these are cases in which sinusitis recurs more than 3 times per year, despite other treatments. HOME CARE INSTRUCTIONS   Drink plenty of water. Water helps thin the mucus so your sinuses can drain more easily.  Use a humidifier.  Inhale steam 3 to 4 times a day (for example, sit in the bathroom with the shower running).  Apply a warm, moist washcloth to your face 3 to 4 times a day, or as directed by your caregiver.  Use saline nasal sprays to help moisten and clean your sinuses.  Take over-the-counter or prescription medicines for pain, discomfort, or fever only as directed by your caregiver. SEEK IMMEDIATE MEDICAL   CARE IF:  You have increasing pain or severe headaches.  You have nausea, vomiting, or drowsiness.  You have swelling around your face.  You have vision problems.  You have a stiff neck.  You have difficulty breathing. MAKE SURE YOU:   Understand these  instructions.  Will watch your condition.  Will get help right away if you are not doing well or get worse. Document Released: 02/13/2005 Document Revised: 05/08/2011 Document Reviewed: 02/28/2011 [employer] Patient Information 2014 [place], [place]. Uterine Fibroid A uterine fibroid is a growth (tumor) that occurs in a woman's uterus. This type of tumor is not cancerous and does not spread out of the uterus. A woman can have one or many fibroids, and the fiboid(s) can become quite large. A fibroid can vary in size, weight, and where it grows in the uterus. Most fibroids do not require medical treatment, but some can cause pain or heavy bleeding during and between periods. CAUSES  A fibroid is the result of a single uterine cell that keeps growing (unregulated), which is different than most cells in the human body. Most cells have a control mechanism that keeps them from reproducing without control.  SYMPTOMS   Bleeding.  Pelvic pain and pressure.  Bladder problems due to the size of the fibroid.  Infertility and miscarriages depending on the size and location of the fibroid. DIAGNOSIS  A diagnosis is made by physical exam. Your caregiver may feel the lumpy tumors during a pelvic exam. Important information regarding size, location, and number of tumors can be gained by having an ultrasound. It is rare that other tests, such as a CT scan or MRI, are needed. TREATMENT   Your caregiver may recommend watchful waiting. This involves getting the fibroid checked by your caregiver to see if the fibroids grow or shrink.   Hormonal treatment or an intrauterine device (IUD) may be prescribed.   Surgery may be needed to remove the fibroids (myomectomy) or the uterus (hysterectomy). This depends on your situation. When fibroids interfere with fertility and a woman wants to become pregnant, a caregiver may recommend having the fibroids removed.  HOME CARE INSTRUCTIONS  Home care depends on how you  were treated. In general:   Keep all follow-up appointments with your caregiver.   Only take medicine as told by your caregiver. Do not take aspirin. It can cause bleeding.   If you have excessive periods and soak tampons or pads in a half hour or less, contact your caregiver immediately. If your periods are troublesome but not so heavy, lie down with your feet raised slightly above your heart. Place cold packs on your lower abdomen.   If your periods are heavy, write down the number of pads or tampons you use per month. Bring this information to your caregiver.   Talk to your caregiver about taking iron pills.   Include green vegetables in your diet.   If you were prescribed a hormonal treatment, take the hormonal medicines as directed.   If you need surgery, ask your caregiver for information on your specific surgery.  SEEK IMMEDIATE MEDICAL CARE IF:  You have pelvic pain or cramps not controlled with medicines.   You have a sudden increase in pelvic pain.   You have an increase of bleeding between and during periods.   You feel lightheaded or have fainting episodes.  MAKE SURE YOU:  Understand these instructions.  Will watch your condition.  Will get help right away if you are not doing well  or get worse. Document Released: 02/11/2000 Document Revised: 05/08/2011 Document Reviewed: 03/06/2011 [facility] Patient Information 2014 [place], [place].

## 2012-11-30 LAB — URINALYSIS W MICROSCOPIC + REFLEX CULTURE
Glucose, UA: NEGATIVE mg/dL
Nitrite: NEGATIVE
Protein, ur: NEGATIVE mg/dL

## 2012-12-01 LAB — URINE CULTURE: Colony Count: >=100000

## 2012-12-04 ENCOUNTER — Telehealth: Payer: Self-pay | Admitting: *Deleted

## 2012-12-04 NOTE — Telephone Encounter (Signed)
Pt informed with lab results on OV 11/29/12.

## 2012-12-19 ENCOUNTER — Ambulatory Visit (INDEPENDENT_AMBULATORY_CARE_PROVIDER_SITE_OTHER): Payer: Managed Care, Other (non HMO) | Admitting: Gynecology

## 2012-12-19 ENCOUNTER — Ambulatory Visit (INDEPENDENT_AMBULATORY_CARE_PROVIDER_SITE_OTHER): Payer: Managed Care, Other (non HMO)

## 2012-12-19 ENCOUNTER — Encounter: Payer: Self-pay | Admitting: Gynecology

## 2012-12-19 DIAGNOSIS — N8300 Follicular cyst of ovary, unspecified side: Secondary | ICD-10-CM

## 2012-12-19 DIAGNOSIS — R1900 Intra-abdominal and pelvic swelling, mass and lump, unspecified site: Secondary | ICD-10-CM

## 2012-12-19 DIAGNOSIS — R9389 Abnormal findings on diagnostic imaging of other specified body structures: Secondary | ICD-10-CM

## 2012-12-19 DIAGNOSIS — Z01411 Encounter for gynecological examination (general) (routine) with abnormal findings: Secondary | ICD-10-CM

## 2012-12-19 NOTE — Progress Notes (Signed)
Patient presented to the office today for an ultrasound. She was seen the office on October this year and during pelvic exam there was an irregularity noted on her retroverted uterus and was nontender. The patient is otherwise asymptomatic her recent lab work consisting of CBC, comprehensive metabolic panel, TSH and lipid profile and urinalysis were all normal.  Ultrasound today: Uterus measured 8.5 x 4.6 x 3.3 cm with endometrial stripe of 1. 9 mm. Right and left ovary were otherwise normal. No apparent masses were seen. Some bowel shadows were noted behind the uterus this may have been stool at time of her pelvic exam. She did mention that after the pelvic exam on last office visit she had a large bowel movement since she had been constipated.  Patient was reassured of the ultrasound findings and is otherwise scheduled to return back to the office in 1 year or when necessary.

## 2013-01-02 ENCOUNTER — Other Ambulatory Visit: Payer: Self-pay

## 2013-06-04 ENCOUNTER — Telehealth: Payer: Self-pay

## 2013-06-04 NOTE — Telephone Encounter (Signed)
Correct: Pack 3 1 take 21 tablets discard last 7, Pack #2 take 21 discard last 7 and every third pack take all 28.

## 2013-06-04 NOTE — Telephone Encounter (Signed)
Patient on OC Leslie Ford(Orsythia).  She is going on vacation end of month and supposed to be when she has her period. She would like to skip placebos and continue active pills and hopefully skip period.  She thought you told her it was okay to do that up to 3 packs without placebos, then have menses as end of 3rd pack.  Pls advise.

## 2013-06-04 NOTE — Telephone Encounter (Signed)
Patient informed/instructed.

## 2013-10-06 ENCOUNTER — Other Ambulatory Visit: Payer: Self-pay

## 2013-10-06 MED ORDER — LEVONORGESTREL-ETHINYL ESTRAD 0.1-20 MG-MCG PO TABS: ORAL_TABLET | ORAL | Status: DC

## 2013-12-12 ENCOUNTER — Other Ambulatory Visit: Payer: Self-pay

## 2014-01-02 ENCOUNTER — Encounter: Payer: Self-pay | Admitting: Gynecology

## 2014-01-07 ENCOUNTER — Encounter: Payer: Managed Care, Other (non HMO) | Admitting: Gynecology

## 2014-01-07 ENCOUNTER — Encounter: Payer: Self-pay | Admitting: Gynecology

## 2014-01-07 ENCOUNTER — Ambulatory Visit (INDEPENDENT_AMBULATORY_CARE_PROVIDER_SITE_OTHER): Payer: 59 | Admitting: Gynecology

## 2014-01-07 ENCOUNTER — Other Ambulatory Visit (HOSPITAL_COMMUNITY)
Admission: RE | Admit: 2014-01-07 | Discharge: 2014-01-07 | Disposition: A | Discharge status: Home / Self Care | Payer: 59 | Source: Ambulatory Visit | Attending: Gynecology | Admitting: Gynecology

## 2014-01-07 VITALS — BP 120/76 | Ht 67.0 in | Wt 137.0 lb

## 2014-01-07 DIAGNOSIS — Z1151 Encounter for screening for human papillomavirus (HPV): Secondary | ICD-10-CM | POA: Diagnosis present

## 2014-01-07 DIAGNOSIS — Z01419 Encounter for gynecological examination (general) (routine) without abnormal findings: Secondary | ICD-10-CM | POA: Diagnosis present

## 2014-01-07 MED ORDER — LEVONORGESTREL-ETHINYL ESTRAD 0.1-20 MG-MCG PO TABS: ORAL_TABLET | ORAL | Status: DC

## 2014-01-07 NOTE — Progress Notes (Signed)
Tomi BambergerBrenda L Virnig 06/01/1975 [id number]   History:    38 y.o.  for annual gyn exam with no complaints today. Patient is having normal menstrual cycle on her oral contraceptive pill.she is no longer taking her Lexapro. Her PCP has been doing her blood work. Patient with no past history of abnormal Pap smears. Many years ago patient was diagnosed with transverse myelitis and is currently not seen a neurologist and doing well otherwise.  Past medical history,surgical history, family history and social history were all reviewed and documented in the EPIC chart.  Gynecologic History Patient's last menstrual period was 12/29/2013. Contraception: OCP (estrogen/progesterone) Last Pap: 2013. Results were: normal Last mammogram: not indicated. Results were: next year will be her first  Obstetric History OB History  Gravida Para Term Preterm AB SAB TAB Ectopic Multiple Living  0                  ROS: A ROS was performed and pertinent positives and negatives are included in the history.  GENERAL: No fevers or chills. HEENT: No change in vision, no earache, sore throat or sinus congestion. NECK: No pain or stiffness. CARDIOVASCULAR: No chest pain or pressure. No palpitations. PULMONARY: No shortness of breath, cough or wheeze. GASTROINTESTINAL: No abdominal pain, nausea, vomiting or diarrhea, melena or bright red blood per rectum. GENITOURINARY: No urinary frequency, urgency, hesitancy or dysuria. MUSCULOSKELETAL: No joint or muscle pain, no back pain, no recent trauma. DERMATOLOGIC: No rash, no itching, no lesions. ENDOCRINE: No polyuria, polydipsia, no heat or cold intolerance. No recent change in weight. HEMATOLOGICAL: No anemia or easy bruising or bleeding. NEUROLOGIC: No headache, seizures, numbness, tingling or weakness. PSYCHIATRIC: No depression, no loss of interest in normal activity or change in sleep pattern.     Exam: chaperone present  BP 120/76 mmHg  Ht 5\' 7"  (1.702 m)  Wt 137 lb  (62.143 kg)  BMI 21.45 kg/m2  LMP 12/29/2013  Body mass index is 21.45 kg/(m^2).  General appearance : Well developed well nourished female. No acute distress HEENT: Neck supple, trachea midline, no carotid bruits, no thyroidmegaly Lungs: Clear to auscultation, no rhonchi or wheezes, or rib retractions  Heart: Regular rate and rhythm, no murmurs or gallops Breast:Examined in sitting and supine position were symmetrical in appearance, no palpable masses or tenderness,  no skin retraction, no nipple inversion, no nipple discharge, no skin discoloration, no axillary or supraclavicular lymphadenopathy Abdomen: no palpable masses or tenderness, no rebound or guarding Extremities: no edema or skin discoloration or tenderness  Pelvic:  Bartholin, Urethra, Skene Glands: Within normal limits             Vagina: No gross lesions or discharge  Cervix: No gross lesions or discharge  Uterus  anteverted, normal size, shape and consistency, non-tender and mobile  Adnexa  Without masses or tenderness  Anus and perineum  normal   Rectovaginal  normal sphincter tone without palpated masses or tenderness             Hemoccult not indicated     Assessment/Plan:  38 y.o. female for annual exam doing well with normal menstrual cycles. Patient on a 20 g low dose oral contraceptive pill. She was reminded of the importance of monthly self breast examination. Patient lost 5 pounds from last year she is trying to exercise and eat healthier. She will need her first baseline mammogram next year. Her PCP has been doing her blood work.Ok Edwards.   FERNANDEZ,JUAN H MD, 1:49 PM  01/07/2014    

## 2014-01-09 ENCOUNTER — Other Ambulatory Visit: Payer: Self-pay

## 2014-01-09 LAB — CYTOLOGY - PAP

## 2014-01-09 MED ORDER — LEVONORGESTREL-ETHINYL ESTRAD 0.1-20 MG-MCG PO TABS: ORAL_TABLET | ORAL | Status: DC

## 2014-11-09 ENCOUNTER — Ambulatory Visit
Admission: RE | Admit: 2014-11-09 | Discharge: 2014-11-09 | Disposition: A | Discharge status: Home / Self Care | Payer: 59 | Source: Ambulatory Visit

## 2014-11-09 ENCOUNTER — Other Ambulatory Visit: Payer: Self-pay

## 2014-11-09 ENCOUNTER — Ambulatory Visit (INDEPENDENT_AMBULATORY_CARE_PROVIDER_SITE_OTHER): Payer: 59

## 2014-11-09 ENCOUNTER — Ambulatory Visit (INDEPENDENT_AMBULATORY_CARE_PROVIDER_SITE_OTHER): Payer: 59 | Admitting: Gynecology

## 2014-11-09 ENCOUNTER — Encounter: Payer: Self-pay | Admitting: Gynecology

## 2014-11-09 VITALS — BP 110/66

## 2014-11-09 DIAGNOSIS — K589 Irritable bowel syndrome without diarrhea: Secondary | ICD-10-CM | POA: Diagnosis not present

## 2014-11-09 DIAGNOSIS — Z1231 Encounter for screening mammogram for malignant neoplasm of breast: Secondary | ICD-10-CM

## 2014-11-09 DIAGNOSIS — R102 Pelvic and perineal pain: Secondary | ICD-10-CM

## 2014-11-09 DIAGNOSIS — K5900 Constipation, unspecified: Secondary | ICD-10-CM | POA: Diagnosis not present

## 2014-11-09 LAB — URINALYSIS W MICROSCOPIC + REFLEX CULTURE
BACTERIA UA: NONE SEEN [HPF]
BILIRUBIN URINE: NEGATIVE
CRYSTALS: NONE SEEN [HPF]
Casts: NONE SEEN [LPF]
GLUCOSE, UA: NEGATIVE
KETONES UR: NEGATIVE
Leukocytes, UA: NEGATIVE
Nitrite: NEGATIVE
PH: 7 (ref 5.0–8.0)
PROTEIN: NEGATIVE
Specific Gravity, Urine: <1.005 (ref 1.001–1.035)
WBC UA: NONE SEEN WBC/HPF (ref <=5)
Yeast: NONE SEEN [HPF]

## 2014-11-09 MED ORDER — LINACLOTIDE 145 MCG PO CAPS: 145.0000 ug | ORAL_CAPSULE | Freq: Every day | ORAL | Status: DC

## 2014-11-09 NOTE — Progress Notes (Signed)
   Patient is a 39 year old gravida 0 who presented to the office today stating that for the past 6 weeks she's had on and off right lower quadrant discomfort. She is gone to an urgent care recently and was thought that she had a urinary tract infection and was placed on and robotic and that was approximately 3 weeks ago. She stated that during the week that she took the antibody she did not feel any right lower quadrant discomfort. Today she denies any GU or GI complaints. No back pain, no fever, no chills, no nausea, or vomiting. Patient is currently on oral contraception pills and is having normal menstrual cycles. She was seen in November of this year for annual exam.  Exam: Blood pressure 110/66 Gen. appearance well-developed well-nourished female in no acute distress Back: No CVA tenderness Abdomen: Soft nontender no rebound or guarding Negative Rovsing, negative obturator, negative heel tap sign Pelvic: Bartholin urethra Skene was within normal limits Vagina: No lesions or discharge Cervix no lesions or discharge Uterus: Anteverted normal size shape and consistency Adnexa: No palpable masses or tenderness Rectal exam: Not done  Urinalysis today: Negative  Ultrasound: Uterus measures 7.7 x 4.6 x 3.2 cm with endometrial stripe of 1 mm. Right ovary normal left ovary normal. No fluid in the cul-de-sac and no adnexal masses.   Assessment/plan: Patient is informing that she has a bowel movement every 3-4 days and she has to strain. Perhaps her discomfort may be attributed to this and IBS. For this reason I'm going to prescribe Linzess 145 g daily and encouraged increasing her fluid intake and fiber diet. Her ultrasound was otherwise normal. If after 3 months with this regimen she continues with the symptoms and went to refer to the gastroenterologist. A requisition to schedule her screening mammogram was provided as well. She scheduled for her annual exam sometime in November.

## 2014-11-09 NOTE — Patient Instructions (Signed)
Linaclotide oral capsules What is this medicine? LINACLOTIDE (lin a KLOE tide) is used to treat irritable bowel syndrome (IBS) with constipation as the main problem. It may also be used for relief of chronic constipation. This medicine may be used for other purposes; ask your health care provider or pharmacist if you have questions. COMMON BRAND NAME(S): Linzess What should I tell my health care provider before I take this medicine? They need to know if you have any of these conditions: -history of stool (fecal) impaction -now have diarrhea or have diarrhea often -other medical condition -stomach or intestinal disease, including bowel obstruction or abdominal adhesions -an unusual or allergic reaction to linaclotide, other medicines, foods, dyes, or preservatives -pregnant or trying to get pregnant -breast-feeding How should I use this medicine? Take this medicine by mouth with a glass of water. Follow the directions on the prescription label. Do not cut, crush or chew this medicine. Take on an empty stomach, at least 30 minutes before your first meal of the day. Take your medicine at regular intervals. Do not take your medicine more often than directed. Do not stop taking except on your doctor's advice. A special MedGuide will be given to you by the pharmacist with each prescription and refill. Be sure to read this information carefully each time. Talk to your pediatrician regarding the use of this medicine in children. This medicine is not approved for use in children. Overdosage: If you think you've taken too much of this medicine contact a poison control center or emergency room at once. Overdosage: If you think you have taken too much of this medicine contact a poison control center or emergency room at once. NOTE: This medicine is only for you. Do not share this medicine with others. What if I miss a dose? If you miss a dose, just skip that dose. Wait until your next dose, and take only  that dose. Do not take double or extra doses. What may interact with this medicine? -certain medicines for bowel problems or bladder incontinence (these can cause constipation) This list may not describe all possible interactions. Give your health care provider a list of all the medicines, herbs, non-prescription drugs, or dietary supplements you use. Also tell them if you smoke, drink alcohol, or use illegal drugs. Some items may interact with your medicine. What should I watch for while using this medicine? Visit your doctor for regular check ups. Tell your doctor if your symptoms do not get better or if they get worse. Diarrhea is a common side effect of this medicine. It often begins within 2 weeks of starting this medicine. Stop taking this medicine and call your doctor if you get severe diarrhea. Stop taking this medicine and call your doctor or go to the nearest hospital emergency room right away if you develop unusual or severe stomach-area (abdominal) pain, especially if you also have bright red, bloody stools or black stools that look like tar. What side effects may I notice from receiving this medicine? Side effects that you should report to your doctor or health care professional as soon as possible: -allergic reactions like skin rash, itching or hives, swelling of the face, lips, or tongue -black, tarry stools -bloody or watery diarrhea -new or worsening stomach pain -severe or prolonged diarrhea Side effects that usually do not require medical attention (Report these to your doctor or health care professional if they continue or are bothersome.): -bloating -gas -loose stools This list may not describe all possible side effects.   Call your doctor for medical advice about side effects. You may report side effects to FDA at 1-800-FDA-1088. Where should I keep my medicine? Keep out of the reach of children. Store at room temperature between 20 and 25 degrees C (68 and 77 degrees F).  Keep this medicine in the original container. Keep tightly closed in a dry place. Do not remove the desiccant packet from the bottle, it helps to protect your medicine from moisture. Throw away any unused medicine after the expiration date. NOTE: This sheet is a summary. It may not cover all possible information. If you have questions about this medicine, talk to your doctor, pharmacist, or health care provider.  2015, [employer]. (2010-11-01 13:05:27)  

## 2014-11-10 LAB — URINE CULTURE
Colony Count: NO GROWTH
Organism ID, Bacteria: NO GROWTH

## 2014-12-04 ENCOUNTER — Telehealth: Payer: Self-pay

## 2014-12-04 NOTE — Telephone Encounter (Signed)
Left detailed message on her voice mail per DPR access note. 

## 2014-12-04 NOTE — Telephone Encounter (Signed)
Patent said at last visit you prescribed Linzess. She has taken it for a month and it has helped her. She asked how long she is to take it and when to stop it?

## 2014-12-04 NOTE — Telephone Encounter (Signed)
Have her stay on it for at least 6 months. And then monitor symptoms. If her symptoms return she can go back on the medication indefinitely

## 2014-12-11 ENCOUNTER — Encounter: Payer: 59 | Admitting: Gynecology

## 2014-12-24 ENCOUNTER — Other Ambulatory Visit: Payer: Self-pay | Admitting: Gynecology

## 2014-12-24 NOTE — Telephone Encounter (Signed)
Annual 01/11/15

## 2015-01-11 ENCOUNTER — Encounter: Payer: Self-pay | Admitting: Gynecology

## 2015-01-11 ENCOUNTER — Ambulatory Visit (INDEPENDENT_AMBULATORY_CARE_PROVIDER_SITE_OTHER): Payer: 59 | Admitting: Gynecology

## 2015-01-11 VITALS — BP 118/76 | Ht 67.0 in | Wt 146.0 lb

## 2015-01-11 DIAGNOSIS — F419 Anxiety disorder, unspecified: Secondary | ICD-10-CM | POA: Diagnosis not present

## 2015-01-11 DIAGNOSIS — Z01419 Encounter for gynecological examination (general) (routine) without abnormal findings: Secondary | ICD-10-CM

## 2015-01-11 DIAGNOSIS — F329 Major depressive disorder, single episode, unspecified: Secondary | ICD-10-CM | POA: Diagnosis not present

## 2015-01-11 DIAGNOSIS — F32A Depression, unspecified: Secondary | ICD-10-CM

## 2015-01-11 MED ORDER — ESCITALOPRAM OXALATE 10 MG PO TABS: 10.0000 mg | ORAL_TABLET | Freq: Every day | ORAL | Status: DC

## 2015-01-11 NOTE — Progress Notes (Signed)
Leslie BambergerBrenda L Ford 08/25/1975 [id number]   History:    39 y.o.  for annual gyn exam  Patient presented to the office today stating that she feels very anxious at times and spell depressed. In the  Past she had been on Lexapro 10 mg daily and would like to restart again. She has good family support has no suicidal ideation. Patient was to discontinue her oral contraceptive pill because she feels  Like she is getting melasma on her face. Patient with no previous history of any abnormal Pap smears.Many years ago patient was diagnosed with transverse myelitis and is currently not seen a neurologist and doing well otherwise.  patient declined flu vaccine today.  Past medical history,surgical history, family history and social history were all reviewed and documented in the EPIC chart.  Gynecologic History Patient's last menstrual period was 12/28/2014. Contraception: OCP (estrogen/progesterone) Last Pap:  2015. Results were: normal Last mammogram:  2016. Results were: normal  Obstetric History OB History  Gravida Para Term Preterm AB SAB TAB Ectopic Multiple Living  0                  ROS: A ROS was performed and pertinent positives and negatives are included in the history.  GENERAL: No fevers or chills. HEENT: No change in vision, no earache, sore throat or sinus congestion. NECK: No pain or stiffness. CARDIOVASCULAR: No chest pain or pressure. No palpitations. PULMONARY: No shortness of breath, cough or wheeze. GASTROINTESTINAL: No abdominal pain, nausea, vomiting or diarrhea, melena or bright red blood per rectum. GENITOURINARY: No urinary frequency, urgency, hesitancy or dysuria. MUSCULOSKELETAL: No joint or muscle pain, no back pain, no recent trauma. DERMATOLOGIC: No rash, no itching, no lesions. ENDOCRINE: No polyuria, polydipsia, no heat or cold intolerance. No recent change in weight. HEMATOLOGICAL: No anemia or easy bruising or bleeding. NEUROLOGIC: No headache, seizures, numbness,  tingling or weakness. PSYCHIATRIC: No depression, no loss of interest in normal activity or change in sleep pattern.     Exam: chaperone present  BP 118/76 mmHg  Ht 5\' 7"  (1.702 m)  Wt 146 lb (66.225 kg)  BMI 22.86 kg/m2  LMP 12/28/2014  Body mass index is 22.86 kg/(m^2).  General appearance : Well developed well nourished female. No acute distress HEENT: Eyes: no retinal hemorrhage or exudates,  Neck supple, trachea midline, no carotid bruits, no thyroidmegaly Lungs: Clear to auscultation, no rhonchi or wheezes, or rib retractions  Heart: Regular rate and rhythm, no murmurs or gallops Breast:Examined in sitting and supine position were symmetrical in appearance, no palpable masses or tenderness,  no skin retraction, no nipple inversion, no nipple discharge, no skin discoloration, no axillary or supraclavicular lymphadenopathy Abdomen: no palpable masses or tenderness, no rebound or guarding Extremities: no edema or skin discoloration or tenderness  Pelvic:  Bartholin, Urethra, Skene Glands: Within normal limits             Vagina: No gross lesions or discharge  Cervix: No gross lesions or discharge  Uterus   anteverted, normal size, shape and consistency, non-tender and mobile  Adnexa  Without masses or tenderness  Anus and perineum  normal   Rectovaginal  normal sphincter tone without palpated masses or tenderness             Hemoccult  Not indicated     Assessment/Plan:  39 y.o. female for annual exam  With past history of depression and anxiety who had been on Lexapro 10 mg daily will be re-prescribed.  Patient's PCP is doing her blood work. She discontinue the Linzess  Because it was contributing to her loose stools I have recommended she use MiraLAX 1 tablespoon daily at bedtime in the event of future constipation. Patient declined the flu vaccine today. Pap smear not indicated that she her. Since patient is going to discontinue the oral contraceptive pill use para  contraception I have recommended that she take a prenatal vitamin daily.   Ok Edwards MD, 2:31 PM 01/11/2015

## 2015-01-11 NOTE — Patient Instructions (Signed)
Escitalopram tablets  What is this medicine?  ESCITALOPRAM (es sye TAL oh pram) is used to treat depression and certain types of anxiety.  This medicine may be used for other purposes; ask your health care provider or pharmacist if you have questions.  What should I tell my health care provider before I take this medicine?  They need to know if you have any of these conditions:  -bipolar disorder or a family history of bipolar disorder  -diabetes  -glaucoma  -heart disease  -kidney or liver disease  -receiving electroconvulsive therapy  -seizures (convulsions)  -suicidal thoughts, plans, or attempt by you or a family member  -an unusual or allergic reaction to escitalopram, the related drug citalopram, other medicines, foods, dyes, or preservatives  -pregnant or trying to become pregnant  -breast-feeding  How should I use this medicine?  Take this medicine by mouth with a glass of water. Follow the directions on the prescription label. You can take it with or without food. If it upsets your stomach, take it with food. Take your medicine at regular intervals. Do not take it more often than directed. Do not stop taking this medicine suddenly except upon the advice of your doctor. Stopping this medicine too quickly may cause serious side effects or your condition may worsen.  A special MedGuide will be given to you by the pharmacist with each prescription and refill. Be sure to read this information carefully each time.  Talk to your pediatrician regarding the use of this medicine in children. Special care may be needed.  Overdosage: If you think you have taken too much of this medicine contact a poison control center or emergency room at once.  NOTE: This medicine is only for you. Do not share this medicine with others.  What if I miss a dose?  If you miss a dose, take it as soon as you can. If it is almost time for your next dose, take only that dose. Do not take double or extra doses.  What may interact with this  medicine?  Do not take this medicine with any of the following medications:  -certain medicines for fungal infections like fluconazole, itraconazole, ketoconazole, posaconazole, voriconazole  -cisapride  -citalopram  -dofetilide  -dronedarone  -linezolid  -MAOIs like Carbex, Eldepryl, Marplan, Nardil, and Parnate  -methylene blue (injected into a vein)  -pimozide  -thioridazine  -ziprasidone  This medicine may also interact with the following medications:  -alcohol  -aspirin and aspirin-like medicines  -carbamazepine  -certain medicines for depression, anxiety, or psychotic disturbances  -certain medicines for migraine headache like almotriptan, eletriptan, frovatriptan, naratriptan, rizatriptan, sumatriptan, zolmitriptan  -certain medicines for sleep  -certain medicines that treat or prevent blood clots like warfarin, enoxaparin, dalteparin  -cimetidine  -diuretics  -fentanyl  -furazolidone  -isoniazid  -lithium  -metoprolol  -NSAIDs, medicines for pain and inflammation, like ibuprofen or naproxen  -other medicines that prolong the QT interval (cause an abnormal heart rhythm)  -procarbazine  -rasagiline  -supplements like St. John's wort, kava kava, valerian  -tramadol  -tryptophan  This list may not describe all possible interactions. Give your health care provider a list of all the medicines, herbs, non-prescription drugs, or dietary supplements you use. Also tell them if you smoke, drink alcohol, or use illegal drugs. Some items may interact with your medicine.  What should I watch for while using this medicine?  Tell your doctor if your symptoms do not get better or if they get worse. Visit your doctor   or health care professional for regular checks on your progress. Because it may take several weeks to see the full effects of this medicine, it is important to continue your treatment as prescribed by your doctor.  Patients and their families should watch out for new or worsening thoughts of suicide or  depression. Also watch out for sudden changes in feelings such as feeling anxious, agitated, panicky, irritable, hostile, aggressive, impulsive, severely restless, overly excited and hyperactive, or not being able to sleep. If this happens, especially at the beginning of treatment or after a change in dose, call your health care professional.  You may get drowsy or dizzy. Do not drive, use machinery, or do anything that needs mental alertness until you know how this medicine affects you. Do not stand or sit up quickly, especially if you are an older patient. This reduces the risk of dizzy or fainting spells. Alcohol may interfere with the effect of this medicine. Avoid alcoholic drinks.  Your mouth may get dry. Chewing sugarless gum or sucking hard candy, and drinking plenty of water may help. Contact your doctor if the problem does not go away or is severe.  What side effects may I notice from receiving this medicine?  Side effects that you should report to your doctor or health care professional as soon as possible:  -allergic reactions like skin rash, itching or hives, swelling of the face, lips, or tongue  -confusion  -feeling faint or lightheaded, falls  -fast talking and excited feelings or actions that are out of control  -hallucination, loss of contact with reality  -seizures  -suicidal thoughts or other mood changes  -unusual bleeding or bruising  Side effects that usually do not require medical attention (report to your doctor or health care professional if they continue or are bothersome):  -blurred vision  -changes in appetite  -change in sex drive or performance  -headache  -increased sweating  -nausea  This list may not describe all possible side effects. Call your doctor for medical advice about side effects. You may report side effects to FDA at 1-800-FDA-1088.  Where should I keep my medicine?  Keep out of reach of children.  Store at room temperature between 15 and 30 degrees C (59 and 86 degrees  F). Throw away any unused medicine after the expiration date.  NOTE: This sheet is a summary. It may not cover all possible information. If you have questions about this medicine, talk to your doctor, pharmacist, or health care provider.     © 2016, [employer]. (2012-09-10 12:32:55)

## 2015-03-16 ENCOUNTER — Other Ambulatory Visit: Payer: Self-pay | Admitting: Gynecology

## 2015-09-17 ENCOUNTER — Other Ambulatory Visit: Payer: Self-pay | Admitting: Gynecology

## 2015-09-17 DIAGNOSIS — Z1231 Encounter for screening mammogram for malignant neoplasm of breast: Secondary | ICD-10-CM

## 2015-10-07 ENCOUNTER — Encounter: Payer: 59 | Admitting: Gynecology

## 2015-10-14 ENCOUNTER — Encounter: Payer: 59 | Admitting: Gynecology

## 2015-10-19 ENCOUNTER — Encounter: Payer: Self-pay | Admitting: Gynecology

## 2015-10-19 ENCOUNTER — Ambulatory Visit (INDEPENDENT_AMBULATORY_CARE_PROVIDER_SITE_OTHER): Payer: BLUE CROSS/BLUE SHIELD | Admitting: Gynecology

## 2015-10-19 VITALS — BP 116/74 | Ht 67.0 in | Wt 142.0 lb

## 2015-10-19 DIAGNOSIS — Z01419 Encounter for gynecological examination (general) (routine) without abnormal findings: Secondary | ICD-10-CM

## 2015-10-19 DIAGNOSIS — R350 Frequency of micturition: Secondary | ICD-10-CM

## 2015-10-19 DIAGNOSIS — R300 Dysuria: Secondary | ICD-10-CM

## 2015-10-19 DIAGNOSIS — B373 Candidiasis of vulva and vagina: Secondary | ICD-10-CM

## 2015-10-19 DIAGNOSIS — N898 Other specified noninflammatory disorders of vagina: Secondary | ICD-10-CM

## 2015-10-19 DIAGNOSIS — N9489 Other specified conditions associated with female genital organs and menstrual cycle: Secondary | ICD-10-CM

## 2015-10-19 DIAGNOSIS — N3000 Acute cystitis without hematuria: Secondary | ICD-10-CM

## 2015-10-19 DIAGNOSIS — IMO0001 Reserved for inherently not codable concepts without codable children: Secondary | ICD-10-CM

## 2015-10-19 DIAGNOSIS — B3731 Acute candidiasis of vulva and vagina: Secondary | ICD-10-CM

## 2015-10-19 LAB — URINALYSIS W MICROSCOPIC + REFLEX CULTURE
Bilirubin Urine: NEGATIVE
CASTS: NONE SEEN [LPF]
Crystals: NONE SEEN [HPF]
Glucose, UA: NEGATIVE
HGB URINE DIPSTICK: NEGATIVE
KETONES UR: NEGATIVE
NITRITE: NEGATIVE
PH: 7 (ref 5.0–8.0)
Protein, ur: NEGATIVE
RBC / HPF: NONE SEEN RBC/HPF (ref <=2)
SPECIFIC GRAVITY, URINE: 1.01 (ref 1.001–1.035)
YEAST: NONE SEEN [HPF]

## 2015-10-19 LAB — WET PREP FOR TRICH, YEAST, CLUE
Clue Cells Wet Prep HPF POC: NONE SEEN
Trich, Wet Prep: NONE SEEN

## 2015-10-19 MED ORDER — PHENAZOPYRIDINE HCL 100 MG PO TABS: 200.0000 mg | ORAL_TABLET | Freq: Three times a day (TID) | ORAL | Status: DC

## 2015-10-19 MED ORDER — FLUCONAZOLE 150 MG PO TABS: 150.0000 mg | ORAL_TABLET | Freq: Once | ORAL | 0 refills | Status: AC

## 2015-10-19 MED ORDER — NITROFURANTOIN MONOHYD MACRO 100 MG PO CAPS: 100.0000 mg | ORAL_CAPSULE | Freq: Two times a day (BID) | ORAL | 0 refills | Status: DC

## 2015-10-19 NOTE — Patient Instructions (Signed)
Fluconazole tablets Qu es este medicamento? El FLUCONAZOL es un medicamento antimictico. Se utiliza para tratar ciertos tipos de infecciones micticas o por levadura. Este medicamento puede ser utilizado para otros usos; si tiene alguna pregunta consulte con su proveedor de atencin mdica o con su farmacutico. Qu le debo informar a mi profesional de la salud antes de tomar este medicamento? Necesita saber si usted presenta alguno de los siguientes problemas o situaciones: -antecedentes de pulso cardaco irregular -enfermedad renal -una reaccin alrgica o inusual al fluconazol, a otros azoles u otros medicamentos, alimentos, colorantes o conservantes -si est embarazada o buscando quedar embarazada -si est amamantando a un beb Cmo debo utilizar este medicamento? Tome este medicamento por va oral. Siga las instrucciones de la etiqueta del [place]. No tome su medicamento con una frecuencia mayor a la indicada. Hable con su pediatra para informarse acerca del uso de este medicamento en nios. Puede requerir atencin especial. Este medicamento ha sido recetado a nios tan menores como de 6 meses de [place]. Sobredosis: Pngase en contacto inmediatamente con un centro toxicolgico o una sala de urgencia si usted cree que haya tomado demasiado medicamento. ATENCIN: [employer] es solo para usted. No comparta este medicamento con nadie. Qu sucede si me olvido de una dosis? Si olvida una dosis, tmela lo antes posible. Si es casi la hora de la prxima dosis, tome slo esa dosis. No tome dosis adicionales o dobles. Qu puede interactuar con este medicamento? No tome esta medicina con ninguno de los siguientes medicamentos: -astemizol -ciertos medicamentos para el pulso cardiaco irregular, tales como dofetilida, dronedarona, quinidina -cisapride -eritromicina -lomitapida -otros medicamentos que prolongan el intervalo QT (causa un ritmo cardiaco  anormal) -pimozida -terfenadina -tioridazina -tolvaptn -ziprasidona Esta medicina tambin puede interactuar con los siguientes medicamentos: -medicamentos antivirales para el VIH o SIDA -pldoras anticonceptivas -ciertos antibiticos, tales como rifabutina, rifampicina -ciertos medicamentos para la presin sangunea, tales como amlodipina, isradipina, felodipino, hidroclorotiazida, losartn, nifedipina -ciertos medicamentos para el cncer, tales como ciclofosfamida, vinblastina, vincristina -ciertos medicamentos para el colesterol, tales como atorvastatina, lovastatina, fluvastatina, simvastatina -ciertos medicamentos para la depresin, ansiedad o trastornos psicticos, tales como amitriptilina, midazolam, nortriptilina, triazolam -ciertos medicamentos para la diabetes, tales como glipizida, gliburida, tolbutamida -ciertos medicamentos para Conservation officer, historic buildings, tales como alfentanilo, fentanilo, metadona -ciertos medicamentos para convulsiones, tales como carbamazepina, fenitona -ciertos medicamentos que tratan o previenen cogulos sanguneos, tales como warfarina -halofantrina -medicamentos que reducen su capacidad de combatir infecciones, tales como ciclosporina, prednisona, tacrolimo -los AINE, medicamentos para el dolor o inflamacin, tales como celecoxib, diclofenaco, flurbiprofeno, ibuprofeno, meloxicam, naproxeno -otros medicamentos para infecciones micticas -sirolims -teofilina -tofacitinib Puede ser que esta lista no menciona todas las posibles interacciones. Informe a su profesional de [employer]	[place] de [employer] productos a base de hierbas, medicamentos de [place] o suplementos nutritivos que est tomando. Si usted fuma, consume bebidas alcohlicas o si utiliza drogas ilegales, indqueselo tambin a su profesional de [employer]	[place]. Algunas sustancias pueden interactuar con su medicamento. A qu debo estar atento al usar [employer]? Visite a su mdico o a su profesional de la salud para  chequear su evolucin peridicamente. Si toma este medicamento durante un perodo de [employer], podr Customer service manager anlisis de [place]. Si los sntomas no mejoran, consulte a su mdico. Pueden transcurrir [employer] o meses de tratamiento antes de que algunas infecciones micticas se curen. El alcohol puede aumentar la posibilidad de daos al hgado. Evite consumir bebidas alcohlicas. Si tiene una infeccin vaginal, no tenga Radio broadcast assistant que [place]  completado el tratamiento. Puede usar una toallita higinica. No use tampones. Use ropa interior de algodn no sintticos, y recin lavadas. Qu efectos secundarios puedo tener al [employer] este medicamento? Efectos secundarios que debe informar a su mdico o a Producer, television/film/video de la salud tan pronto como sea posible: -Therapist, art como erupcin cutnea, picazn o urticarias, hinchazn de los labios, boca, lengua o garganta -orina de color amarillo oscuro -sensacin de mareos o desmayos -pulso cardiaco irregular o dolor en el pecho -enrojecimiento, formacin de ampollas, descamacin o distensin de la piel, inclusive dentro de la boca -dificultad al respirar -sangrado o magulladuras inusuales -vmito -color amarillento de los ojos o la piel Efectos secundarios que, por lo general, no requieren Psychologist, prison and probation services (debe informarlos a su mdico o a Producer, television/film/video de la salud si persisten o si son molestos): -cambios en el sabor de los alimentos -diarrea -dolor de cabeza -Programme researcher, broadcasting/film/video o nuseas Puede ser que esta lista no menciona todos los posibles efectos secundarios. Comunquese a su mdico por asesoramiento mdico [employer]. Usted puede informar los efectos secundarios a la FDA por telfono al 1-800-FDA-1088. Dnde debo guardar mi medicina? Mantngala fuera del alcance de los nios. Gurdela a [employer], a menos de 30 grados C (86 grados F). Deseche todo el medicamento que no  haya utilizado, despus de la fecha de vencimiento. ATENCIN: Este folleto es un resumen. Puede ser que no cubra toda la posible informacin. Si usted tiene preguntas acerca de esta medicina, consulte con su mdico, su farmacutico o su profesional de Radiographer, therapeutic.    2016, Elsevier/Gold Standard. (2014-04-08 00:00:00) Vaginitis monilisica (Monilial Vaginitis) La vaginitis es una inflamacin (irritacin, hinchazn) de la vagina y la vulva. Esta no es una enfermedad de transmisin sexual.  CAUSAS Este tipo de vaginitis lo causa un hongo (candida) que normalmente se encuentra en la vagina. El hongo candida se ha desarrollado hasta el punto de ocasionar problemas en el equilibrio qumico. SNTOMAS  Secrecin vaginal espesa y blanca.  Hinchazn, picazn, enrojecimiento e inflamacin de la vagina y en algunos casos de los labios vaginales (vulva).  Ardor o dolor al [employer].  Dolor en las relaciones sexuales. DIAGNSTICO Los factores que favorecen la vaginitis moniliasica son:  Everlean Patterson de virginidad y postmenopusicas.  Embarazo.  Infecciones.  Sentir cansancio, estar enferma o estresada, especialmente si ya ha sufrido este problema en el pasado.  Diabetes Buen control ayudar a disminur la probabilidad.  Pldoras anticonceptivas  Ropa interior [place].  El uso de espumas de bao, aerosoles femeninos duchas vaginales o tampones con desodorante.  Algunos antibiticos (medicamentos que destruyen grmenes).  Si contrae alguna enfermedad puede sufrir recurrencias espordicas. TRATAMIENTO El profesional que lo asiste prescribir medicamentos.  Hay diferentes tipos de cremas y supositorios vaginales que tratan especficamente la vaginitis monilisica. Para infecciones por hongos recurrentes, utilice un supositorio o crema en la vagina dos veces por semana, o segn se le indique.  Tambin podrn utilizarse cremas con corticoides o anti monilisicas para la picazn o la irritacin de  la vulva. Consulte con el profesional que la asiste.  Si la crema no da resultado, podr aplicarse en la vagina una solucin con azul de metileno.  El consumo de yogur puede prevenir este tipo de vaginitis. INSTRUCCIONES PARA EL CUIDADO DOMICILIARIO  Tome todos los medicamentos tal como se le indic.  No mantenga relaciones sexuales hasta que el tratamiento se haya completado, o segn las indicaciones del profesional que la asiste.  Tome baos de asiento tibios.  No se aplique duchas vaginales.  No utilice tampones, especialmente los perfumados.  Use ropa interior de algodn  Mir[employer] los pantalones ajustados y las medias tipo panty.  Comunique a sus compaeros sexuales que sufre una infeccin por hongos. Ellos deben concurrir para un control mdico si tienen sntomas como una urticaria leve o picazn.  Sus compaeros sexuales deben tratarse tambin si la infeccin es difcil de Pharmacologisteliminar.  Practique el sexo seguro - use condones  Algunos medicamentos vaginales ocasionan fallas en los condones de ltex. Los medicamentos vaginales que pueden daar los condones son:  ChiropodistCrema cleocina  Butoconazole (Femstat)  Terconazole (Terazol) supositorios vaginales  Miconazole (Monistat) (es un medicamento de venta libre) SOLICITE ATENCIN MDICA SI:  Daphane ShepherdUsted tiene una temperatura oral de ms de 38,9 C (102 F).  Si la infeccin empeora luego de 2 [address] de tratamiento.  Si la infeccin no mejora luego de 3 [address]das de tratamiento.  Aparecen ampollas en o alrededor de la vagina.  Si aparece una hemorragia vaginal y no es el momento del perodo.  Siente dolor al ConocoPhillipsorinar.  Presenta problemas intestinales.  Tiene dolor durante las The St. Paul Travelersrelaciones sexuales.   Esta informacin no tiene Theme park managercomo fin reemplazar el consejo del mdico. Asegrese de hacerle al mdico cualquier pregunta que tenga.   Document Released: 11/23/2004 Document Revised: 05/08/2011 Elsevier Interactive Patient Education 2016  [employer] Phenazopyridine tablets Qu es este medicamento? La FENAZOPIRIDINA es un analgsico. Sunday SpillersSe utiliza para Engineer, materialsaliviar el dolor, ardor o molestia causada por una infeccin o irritacin del tracto urinario. Este medicamento no es un antibitico. No curar una infeccin del tracto urinario. Este medicamento puede ser utilizado para otros usos; si tiene alguna pregunta consulte con su proveedor de atencin mdica o con su farmacutico. Qu le debo informar a mi profesional de la salud antes de tomar este medicamento? Necesita saber si usted presenta alguno de los siguientes problemas o situaciones: -deficiencia de glucosa-6-fosfato deshidrogenasa (L-6PD) -enfermedad renal -una reaccin alrgica o inusual a la fenazopiridina, a otros medicamentos, alimentos, colorantes o conservantes -si est embarazada o buscando quedar embarazada -si est amamantando a un beb Cmo debo utilizar este medicamento? Tome este medicamento por va oral con un vaso de agua. Siga las instrucciones de la etiqueta del Crabtreemedicamento. Tmelo despus de las comidas. Tome sus dosis a intervalos regulares. No tome su medicamento con una frecuencia mayor que la indicada. No omita ninguna dosis o suspenda el uso de su medicamento antes de lo indicado aun si se siente mejor. No deje de tomarlo excepto si as lo indica su mdico. Hable con su pediatra para informarse acerca del uso de este medicamento en nios. Puede requerir atencin especial. Sobredosis: Pngase en contacto inmediatamente con un centro toxicolgico o una sala de urgencia si usted cree que haya tomado demasiado medicamento. ATENCIN: Reynolds AmericanEste medicamento es solo para usted. No comparta este medicamento con nadie. Qu sucede si me olvido de una dosis? Si olvida una dosis, tmela lo antes posible. Si es casi la hora de la prxima dosis, tome slo esa dosis. No tome dosis adicionales o dobles. Qu puede interactuar con este medicamento? No se esperan  interacciones. Puede ser que esta lista no menciona todas las posibles interacciones. Informe a su profesional de Beazer Homesla salud de In[employer] Inctodos los productos a base de hierbas, medicamentos de [place] Castleventa libre o suplementos nutritivos que est tomando. Si usted fuma, consume bebidas alcohlicas o si utiliza drogas ilegales, indqueselo tambin a su profesional de Beazer Homesla salud. Algunas sustancias pueden interactuar con su medicamento. A  qu debo estar atento al usar este medicamento? Si los sntomas no mejoran o si empeoran, consulte con su mdico o con su profesional de [employer]. Este medicamento produce un color rojo en los lquidos corporales. Este efecto es inofensivo y Geneticist, molecular despus de que deje de tomar este medicamento. Este medicamento cambiar el color de su orina a un color naranja oscura o rojo. El color rojo puede manchar la ropa. Los lentes de contacto se pueden Soil scientist. Es preferible no usar lentes de contacto blandos mientras est tomando este medicamento Si es diabtico podr Barista un resultado positivo falso en los anlisis de determinacin del nivel de International aid/development worker en la orina. Consulte a su proveedor de Psychologist, prison and probation services. Qu efectos secundarios puedo tener al [employer] este medicamento? Efectos secundarios que debe informar a su mdico o a Producer, television/film/video de la salud tan pronto como sea posible: -Therapist, art como erupcin cutnea, picazn o urticarias, hinchazn de la cara, labios o lengua -color azul o morado de la piel -dificultad al respirar -fiebre -orinar menos -sangrado, magulladuras inusuales -cansancio o debilidad inusual -vmitos -color amarillento de los ojos o la piel Efectos secundarios que, por lo general, no requieren Psychologist, prison and probation services (debe informarlos a su mdico o a su profesional de la salud si persisten o si son molestos): -orina de color amarillo oscuro -dolor de cabeza -Programme researcher, broadcasting/film/video Puede ser que esta lista no menciona todos los posibles efectos  secundarios. Comunquese a su mdico por asesoramiento mdico [employer]ard. Usted puede informar los efectos secundarios a la FDA por telfono al 1-800-FDA-1088. Dnde debo guardar mi medicina? Mantngala fuera del alcance de los nios. Gurdela a [employer], entre 15 y 30 grados C (54 y 68 grados F). Protjala de la luz y de la humedad. Deseche todo el medicamento que no haya utilizado, despus de la fecha de vencimiento. ATENCIN: Este folleto es un resumen. Puede ser que no cubra toda la posible informacin. Si usted tiene preguntas acerca de esta medicina, consulte con su mdico, su farmacutico o su profesional de Radiographer, therapeutic.    2016, Elsevier/Gold Standard. (2014-04-07 00:00:00) Nitrofurantoin tablets or capsules Qu es este medicamento? La NITROFURANTONA es un antibitico. Se utiliza en el tratamiento de la infecciones del tracto urinario. Este medicamento puede ser utilizado para otros usos; si tiene alguna pregunta consulte con su proveedor de atencin mdica o con su farmacutico. Qu le debo informar a mi profesional de la salud antes de tomar este medicamento? Necesita saber si usted presenta alguno de los [employer] o situaciones: -anemia -diabetes -deficiencia de glucosa-6-fosfato deshidrogenasa -enfermedad renal -enfermedad heptica -enfermedad pulmonar -otras enfermedades crnicas -una reaccin alrgica o inusual a la nitrofurantona, a otros antibiticos, a otros medicamentos, alimentos, colorantes o conservantes -si est embarazada o buscando quedar embarazada -si est amamantando a un beb Cmo debo utilizar este medicamento? Tome este medicamento por va oral con un vaso de agua. Siga las instrucciones de la etiqueta del [place]. Tome este medicamento con leche o con alimentos. Tome sus dosis a intervalos regulares. No tome su medicamento con una frecuencia mayor que la indicada. No deje de tomarlo excepto si as lo indica su  mdico. Hable con su pediatra para informarse acerca del uso de este medicamento en nios. Aunque este medicamento se puede recetar para condiciones selectivas, las precauciones se aplican. Sobredosis: Pngase en contacto inmediatamente con un centro toxicolgico o una sala de urgencia si usted cree que haya tomado demasiado medicamento. ATENCIN: [employer] es solo para usted. No  comparta este medicamento con nadie. Qu sucede si me olvido de una dosis? Si olvida una dosis, tmela lo antes posible. Si es casi la hora de la prxima dosis, tome slo esa dosis. No tome dosis adicionales o dobles. Qu puede interactuar con este medicamento? -anticidos que contienen trisilicato de magnesio -probenecid -antibiticos quinolnicos, tales como ciprofloxacina, lomefloxacino, norfloxacino y ofloxacino -sulfapirazona Puede ser que esta lista no menciona todas las posibles interacciones. Informe a su profesional de [employer] de [employer] Inc productos a base de hierbas, medicamentos de [place] o suplementos nutritivos que est tomando. Si usted fuma, consume bebidas alcohlicas o si utiliza drogas ilegales, indqueselo tambin a su profesional de [employer]. Algunas sustancias pueden interactuar con su medicamento. A qu debo estar atento al usar [employer]? Si los sntomas no mejoran o si experimenta nuevos sntomas, consulte con su mdico o su profesional de [employer]. Beba varios vasos de Warehouse manager. Si toma este medicamento durante un perodo de [employer], debe visitar a su mdico para chequear su evolucin peridicamente. Si es diabtico, podr Barista un resultado positivo falso en los [employer] de determinacin del nivel de azcar en la orina con algunas marcas de [place] de [place]. Consulte con su mdico. Qu efectos secundarios puedo tener al [employer] este medicamento? Efectos secundarios que debe informar a su mdico o a Producer, television/film/video de la salud tan pronto como sea  posible: -Therapist, art como erupcin cutnea o urticarias, hinchazn de la cara, labios o lengua -dolor en el pecho -tos -dificultad al respirar -mareos, somnolencia -fiebre o infeccin -molestias o dolores articulares -piel plida o teida azul -enrojecimiento, formacin de ampollas, descamacin o aflojamiento de la piel, inclusive dentro de la boca -hormigueo, ardor, Engineer, mining o entumecimiento de las manos o los pies -sangrado o magulladuras inusuales -cansancio o debilidad inusual -color amarillento de ojos o piel Efectos secundarios que, por lo general, no requieren Psychologist, prison and probation services (debe informarlos a su mdico o a su profesional de la salud si persisten o si son molestos): -orina de color amarillo oscuro -diarrea -dolor de cabeza -prdida del apetito -nuseas o vmitos -prdida del cabello temporal Puede ser que esta lista no menciona todos los posibles efectos secundarios. Comunquese a su mdico por asesoramiento mdico [employer]ard. Usted puede informar los efectos secundarios a la FDA por telfono al 1-800-FDA-1088. Dnde debo guardar mi medicina? Mantngala fuera del alcance de los nios. Gurdela a [employer], entre 15 y 30 grados C (61 y 20 grados F). Protjala de la luz. Deseche todo el medicamento que no haya utilizado, despus de la fecha de vencimiento. ATENCIN: Este folleto es un resumen. Puede ser que no cubra toda la posible informacin. Si usted tiene preguntas acerca de esta medicina, consulte con su mdico, su farmacutico o su profesional de Radiographer, therapeutic.    2016, Elsevier/Gold Standard. (2014-04-07 00:00:00) Infeccin urinaria  (Urinary Tract Infection)  La infeccin urinaria puede ocurrir en cualquier lugar del tracto urinario. El tracto urinario es un sistema de drenaje del cuerpo por el que se eliminan los desechos y el exceso de [place]. El tracto urinario est formado por dos riones, dos urteres, la vejiga y Engineer, mining. Los  riones son rganos que tienen forma de frijol. Cada rin tiene aproximadamente el tamao del puo. Estn situados debajo de las [place], uno a cada lado de la columna vertebral CAUSAS  La causa de la infeccin son los microbios, que son organismos microscpicos, que incluyen hongos, virus, y bacterias. Estos organismos son  tan pequeos que slo pueden verse a travs del microscopio. Las bacterias son los microorganismos que ms comnmente causan infecciones urinarias.  SNTOMAS  Los sntomas pueden variar segn la edad y el sexo del paciente y por la ubicacin de la infeccin. Los sntomas en las mujeres jvenes incluyen la necesidad frecuente e intensa de orinar y una sensacin dolorosa de ardor en la vejiga o en la uretra durante la miccin. Las mujeres y los hombres mayores podrn sentir cansancio, temblores y debilidad y Futures tradersentir dolores musculares y Engineer, miningdolor abdominal. Si tiene [place]it[place]fiebre, puede significar que la infeccin est en los riones. Otros sntomas son dolor en la espalda o en los lados debajo de las Sumnercostillas, nuseas y vmitos.  DIAGNSTICO  Para diagnosticar una infeccin urinaria, el mdico le preguntar acerca de sus sntomas. Genuine Partsambin le solicitar una [place]muestra de [place]. La muestra de orina se analiza para Engineer, manufacturingdetectar bacterias y glbulos blancos de Risk managerla sangre. Los glbulos blancos se forman en el organismo para ayudar a Artistcombatir las infecciones.  TRATAMIENTO  Por lo general, las infecciones urinarias pueden tratarse con medicamentos. Debido a que la Harley-Davidsonmayora de las infecciones son causadas por bacterias, por lo general pueden tratarse con antibiticos. La eleccin del antibitico y la duracin del tratamiento depender de sus sntomas y el tipo de bacteria causante de la infeccin.  INSTRUCCIONES PARA EL CUIDADO EN EL HOGAR   Si le recetaron antibiticos, tmelos exactamente como su mdico le indique. Termine el medicamento aunque se sienta mejor despus de haber tomado slo algunos.  Beba  gran cantidad de lquido para mantener la orina de tono claro o color amarillo plido.  Evite la cafena, el t y las 250 Hospital Placebebidas gaseosas. Estas sustancias irritan la vejiga.  Vaciar la vejiga con frecuencia. Evite retener la orina durante largos perodos.  Vace la vejiga antes y despus de Management consultanttener relaciones sexuales.  Despus de mover el intestino, las mujeres deben higienizarse la regin perineal desde adelante hacia atrs. Use slo un papel tissue por vez. SOLICITE ATENCIN MDICA SI:   Siente dolor en la espalda.  Le sube la fiebre.  Los sntomas no mejoran luego de [address] das. SOLICITE ATENCIN MDICA DE INMEDIATO SI:   Siente dolor intenso en la espalda o en la zona inferior del abdomen.  Comienza a sentir escalofros.  Tiene nuseas o vmitos.  Tiene una sensacin continua de quemazn o molestias al ConocoPhillipsorinar. ASEGRESE DE QUE:   Comprende estas instrucciones.  Controlar su enfermedad.  Solicitar ayuda de inmediato si no mejora o empeora.   Esta informacin no tiene Theme park managercomo fin reemplazar el consejo del mdico. Asegrese de hacerle al mdico cualquier pregunta que tenga.   Document Released: 11/23/2004 Document Revised: 11/08/2011 Elsevier Interactive Patient Education [employer]2016 Elsevier Inc.

## 2015-10-19 NOTE — Progress Notes (Signed)
Leslie BambergerBrenda L Ford 09/24/1975 [id number]   History:    40 y.o.  for annual gyn exam who had 2 additional complaints today one being of urinary frequency and is sure he a. She stated she's had 2 urinary tract infections in the past but [place] She denies any fever, chills, nausea, vomiting or any back pain. She was also complaining of vaginal discharge with odor for which she had gone to an urgent care recently and had given her prescription for fluconazole 100 mg which she took one by mouth. She is in a monogamous relationship and currently using condoms for contraception reports normal menstrual cycles. Patient with no prior history of any abnormal Pap smears.normal  Past medical history,surgical history, family history and social history were all reviewed and documented in the EPIC chart.  Gynecologic History Patient's last menstrual period was 10/05/2015. Contraception: condoms Last Pap: 2015. Results were: normal Last mammogram: 2017. Results were: Normal but dense  Obstetric History OB History  Gravida Para Term Preterm AB Living  0            SAB TAB Ectopic Multiple Live Births                    ROS: A ROS was performed and pertinent positives and negatives are included in the history.  GENERAL: No fevers or chills. HEENT: No change in vision, no earache, sore throat or sinus congestion. NECK: No pain or stiffness. CARDIOVASCULAR: No chest pain or pressure. No palpitations. PULMONARY: No shortness of breath, cough or wheeze. GASTROINTESTINAL: No abdominal pain, nausea, vomiting or diarrhea, melena or bright red blood per rectum. GENITOURINARY: No urinary frequency, urgency, hesitancy or dysuria. MUSCULOSKELETAL: No joint or muscle pain, no back pain, no recent trauma. DERMATOLOGIC: No rash, no itching, no lesions. ENDOCRINE: No polyuria, polydipsia, no heat or cold intolerance. No recent change in weight. HEMATOLOGICAL: No anemia or  easy bruising or bleeding. NEUROLOGIC: No headache, seizures, numbness, tingling or weakness. PSYCHIATRIC: No depression, no loss of interest in normal activity or change in sleep pattern.     Exam: chaperone present  BP 116/74   Ht 5\' 7"  (1.702 m)   Wt 142 lb (64.4 kg)   LMP 10/05/2015   BMI 22.24 kg/m   Body mass index is 22.24 kg/m.  General appearance : Well developed well nourished female. No acute distress HEENT: Eyes: no retinal hemorrhage or exudates,  Neck supple, trachea midline, no carotid bruits, no thyroidmegaly Lungs: Clear to auscultation, no rhonchi or wheezes, or rib retractions  Heart: Regular rate and rhythm, no murmurs or gallops Breast:Examined in sitting and supine position were symmetrical in appearance, no palpable masses or tenderness,  no skin retraction, no nipple inversion, no nipple discharge, no skin discoloration, no axillary or supraclavicular lymphadenopathy Abdomen: no palpable masses or tenderness, no rebound or guarding Extremities: no edema or skin discoloration or tenderness  Pelvic:  Bartholin, Urethra, Skene Glands: Within normal limits             Vagina: No gross lesions or discharge  Cervix: No gross lesions or discharge  Uterus  anteverted, normal size, shape and consistency, non-tender and mobile  Adnexa  Without masses or tenderness  Anus and perineum  normal   Rectovaginal  normal sphincter tone without palpated masses or tenderness             Hemoccult not indicated   Wet prep few yeast,  moderate white blood cell, moderate bacteria  Urinalysis moderate bacteria, 20-WBC, no red blood cells seen  Assessment/Plan:  40 y.o. female for annual exam had her blood work done by her PCP recently. Patient will be treated for yeast vaginitis with an additional round of Diflucan 150 mg 1 by mouth today. For urinary tract infection she'll be prescribed Macrobid one by mouth twice a day for 7 days. For her bladder spasm and dysuria she'll be  prescribed Pyridium 200 mg one by mouth 3 times a day for 3 days. Patient was reminded on the importance of monthly breast exams. Also she was reminded the next year to schedule for a three-dimensional mammogram. She will be due for Pap smear next year.  An additional 15 minutes was undertaken to address and treat her vaginal discharge and pruritus and urinary frequency and dysuria.   Ok EdwardsFERNANDEZ,Malana Eberwein H MD, 2:47 PM 10/19/2015

## 2015-10-20 ENCOUNTER — Telehealth: Payer: Self-pay | Admitting: *Deleted

## 2015-10-20 MED ORDER — PHENAZOPYRIDINE HCL 200 MG PO TABS: ORAL_TABLET | ORAL | 0 refills | Status: DC

## 2015-10-20 NOTE — Telephone Encounter (Signed)
Pt called stating Rx for pyridium 200 tablet was never sent to pharmacy, in epic Rx is under clinic admin. Medication so Rx was never received at pharmacy. Rx will now be sent.

## 2015-10-21 LAB — URINE CULTURE: Organism ID, Bacteria: NO GROWTH

## 2015-11-04 ENCOUNTER — Ambulatory Visit: Payer: 59

## 2015-11-09 ENCOUNTER — Ambulatory Visit: Payer: 59

## 2016-03-09 ENCOUNTER — Other Ambulatory Visit: Payer: Self-pay | Admitting: Gynecology

## 2016-03-09 NOTE — Telephone Encounter (Signed)
Was prescribed for patient at 12/2014 [facility] but no Rx/refills given at 12/2015 [facility].

## 2016-03-09 NOTE — Telephone Encounter (Signed)
You had been prescribing MD in 2014. Looks like she stopped it for awhile but last year at [facility] you restarted her and gave her Rx for one year.

## 2016-03-09 NOTE — Telephone Encounter (Signed)
Please have patient check with her primary who has been prescribing this in the past

## 2016-03-09 NOTE — Telephone Encounter (Signed)
Okay 

## 2016-05-14 ENCOUNTER — Other Ambulatory Visit: Payer: Self-pay | Admitting: Gynecology

## 2016-05-15 NOTE — Telephone Encounter (Signed)
Per note 11/16  "on Lexapro 10 mg daily and would like to restart again." no mention of Rx in 09/2015 note.

## 2016-05-15 NOTE — Telephone Encounter (Signed)
Overdue for annual exam.

## 2016-07-12 ENCOUNTER — Encounter: Payer: Self-pay | Admitting: Gynecology

## 2016-11-20 ENCOUNTER — Encounter: Payer: BLUE CROSS/BLUE SHIELD | Admitting: Obstetrics & Gynecology

## 2016-12-05 ENCOUNTER — Encounter: Payer: Self-pay | Admitting: Obstetrics & Gynecology

## 2016-12-05 ENCOUNTER — Ambulatory Visit (INDEPENDENT_AMBULATORY_CARE_PROVIDER_SITE_OTHER): Payer: BLUE CROSS/BLUE SHIELD | Admitting: Obstetrics & Gynecology

## 2016-12-05 VITALS — BP 112/78 | Ht 67.0 in | Wt 147.0 lb

## 2016-12-05 DIAGNOSIS — Z789 Other specified health status: Secondary | ICD-10-CM

## 2016-12-05 DIAGNOSIS — Z01419 Encounter for gynecological examination (general) (routine) without abnormal findings: Secondary | ICD-10-CM | POA: Diagnosis not present

## 2016-12-05 DIAGNOSIS — Z1151 Encounter for screening for human papillomavirus (HPV): Secondary | ICD-10-CM | POA: Diagnosis not present

## 2016-12-05 NOTE — Progress Notes (Signed)
    Leslie Ford Dec 29, 1975 [id number]   History:    41 y.o. G0  Married x 18 yrs  RP:  Established patient presenting for annual gyn exam  HPI:  Menses regular normal every month.  Using condoms for contraception.  No pelvic pain.  No previous abnormal pap.  Last pap 2015.  Breasts wnl.  3D Mammo 10/2016 with Left Dx mammo/US neg.  Will repeat in 6 months.  Mictions/BMs wnl.  BMI 23.02.  Past medical history,surgical history, family history and social history were all reviewed and documented in the EPIC chart.  Gynecologic History Patient's last menstrual period was 11/18/2016. Contraception: condoms Last Pap: 2015. Results were: normal Last mammogram: 10/2016. Results were: Rt normal.  Lt distortion.  Lt Dx mammo/UA wnl.  Obstetric History OB History  Gravida Para Term Preterm AB Living  0            SAB TAB Ectopic Multiple Live Births                    ROS: A ROS was performed and pertinent positives and negatives are included in the history.  GENERAL: No fevers or chills. HEENT: No change in vision, no earache, sore throat or sinus congestion. NECK: No pain or stiffness. CARDIOVASCULAR: No chest pain or pressure. No palpitations. PULMONARY: No shortness of breath, cough or wheeze. GASTROINTESTINAL: No abdominal pain, nausea, vomiting or diarrhea, melena or bright red blood per rectum. GENITOURINARY: No urinary frequency, urgency, hesitancy or dysuria. MUSCULOSKELETAL: No joint or muscle pain, no back pain, no recent trauma. DERMATOLOGIC: No rash, no itching, no lesions. ENDOCRINE: No polyuria, polydipsia, no heat or cold intolerance. No recent change in weight. HEMATOLOGICAL: No anemia or easy bruising or bleeding. NEUROLOGIC: No headache, seizures, numbness, tingling or weakness. PSYCHIATRIC: No depression, no loss of interest in normal activity or change in sleep pattern.     Exam:   BP 112/78   Ht  (1.702 m)   Wt 147 lb (66.7 kg)   LMP 11/18/2016 Comment: NO BIRTH  CONTROL   BMI 23.02 kg/m   Body mass index is 23.02 kg/m.  General appearance : Well developed well nourished female. No acute distress HEENT: Eyes: no retinal hemorrhage or exudates,  Neck supple, trachea midline, no carotid bruits, no thyroidmegaly Lungs: Clear to auscultation, no rhonchi or wheezes, or rib retractions  Heart: Regular rate and rhythm, no murmurs or gallops Breast:Examined in sitting and supine position were symmetrical in appearance, no palpable masses or tenderness,  no skin retraction, no nipple inversion, no nipple discharge, no skin discoloration, no axillary or supraclavicular lymphadenopathy Abdomen: no palpable masses or tenderness, no rebound or guarding Extremities: no edema or skin discoloration or tenderness  Pelvic: Vulva normal  Bartholin, Urethra, Skene Glands: Within normal limits             Vagina: No gross lesions or discharge  Cervix: No gross lesions or discharge.  Pap/HPV done.  Uterus  AV, normal size, shape and consistency, non-tender and mobile  Adnexa  Without masses or tenderness  Anus and perineum  normal    Assessment/Plan:  41 y.o. female for annual exam   1. Encounter for routine gynecological examination with Papanicolaou smear of cervix Normal gyn exam.  Pap/HPV HR done.  Breasts wnl.  2. Uses condoms for contraception    [name], 12:26 PM 12/05/2016

## 2016-12-05 NOTE — Addendum Note (Signed)
Addended by: Berna Spare A on: 12/05/2016 01:55 PM   Modules accepted: Orders

## 2016-12-05 NOTE — Patient Instructions (Signed)
1. Encounter for routine gynecological examination with Papanicolaou smear of cervix Normal gyn exam.  Pap/HPV HR done.  Breasts wnl.  2. Uses condoms for contraception   Leslie Ford, it was a pleasure to meet you today!  I will inform you of your results as soon as available.  Health Maintenance, Female Adopting a healthy lifestyle and getting preventive care can go a long way to promote health and wellness. Talk with your health care provider about what schedule of regular examinations is right for you. This is a good chance for you to check in with your provider about disease prevention and staying healthy. In between checkups, there are plenty of things you can do on your own. Experts have done a lot of research about which lifestyle changes and preventive measures are most likely to keep you healthy. Ask your health care provider for more information. Weight and diet Eat a healthy diet  Be sure to include plenty of vegetables, fruits, low-fat dairy products, and lean protein.  Do not eat a lot of foods high in solid fats, added sugars, or salt.  Get regular exercise. This is one of the most important things you can do for your health. ? Most adults should exercise for at least 150 minutes each week. The exercise should increase your heart rate and make you sweat (moderate-intensity exercise). ? Most adults should also do strengthening exercises at least twice a week. This is in addition to the moderate-intensity exercise.  Maintain a healthy weight  Body mass index (BMI) is a measurement that can be used to identify possible weight problems. It estimates body fat based on height and weight. Your health care provider can help determine your BMI and help you achieve or maintain a healthy weight.  For females 75 years of age and older: ? A BMI below 18.5 is considered underweight. ? A BMI of 18.5 to 24.9 is normal. ? A BMI of 25 to 29.9 is considered overweight. ? A BMI of 30 and above is  considered obese.  Watch levels of cholesterol and blood lipids  You should start having your blood tested for lipids and cholesterol at 42 years of age, then have this test every 5 years.  You may need to have your cholesterol levels checked more often if: ? Your lipid or cholesterol levels are high. ? You are older than 42 years of age. ? You are at high risk for heart disease.  Cancer screening Lung Cancer  Lung cancer screening is recommended for adults 72-52 years old who are at high risk for lung cancer because of a history of smoking.  A yearly low-dose CT scan of the lungs is recommended for people who: ? Currently smoke. ? Have quit within the past 15 years. ? Have at least a 30-pack-year history of smoking. A pack year is smoking an average of one pack of cigarettes a day for 1 year.  Yearly screening should continue until it has been 15 years since you quit.  Yearly screening should stop if you develop a health problem that would prevent you from having lung cancer treatment.  Breast Cancer  Practice breast self-awareness. This means understanding how your breasts normally appear and feel.  It also means doing regular breast self-exams. Let your health care provider know about any changes, no matter how small.  If you are in your 20s or 30s, you should have a clinical breast exam (CBE) by a health care provider every 1-3 years as part of  a regular health exam.  If you are 19 or older, have a CBE every year. Also consider having a breast X-ray (mammogram) every year.  If you have a family history of breast cancer, talk to your health care provider about genetic screening.  If you are at high risk for breast cancer, talk to your health care provider about having an MRI and a mammogram every year.  Breast cancer gene (BRCA) assessment is recommended for women who have family members with BRCA-related cancers. BRCA-related cancers  include: ? Breast. ? Ovarian. ? Tubal. ? Peritoneal cancers.  Results of the assessment will determine the need for genetic counseling and BRCA1 and BRCA2 testing.  Cervical Cancer Your health care provider may recommend that you be screened regularly for cancer of the pelvic organs (ovaries, uterus, and vagina). This screening involves a pelvic examination, including checking for microscopic changes to the surface of your cervix (Pap test). You may be encouraged to have this screening done every 3 years, beginning at age 34.  For women ages 4-65, health care providers may recommend pelvic exams and Pap testing every 3 years, or they may recommend the Pap and pelvic exam, combined with testing for human papilloma virus (HPV), every 5 years. Some types of HPV increase your risk of cervical cancer. Testing for HPV may also be done on women of any age with unclear Pap test results.  Other health care providers may not recommend any screening for nonpregnant women who are considered low risk for pelvic cancer and who do not have symptoms. Ask your health care provider if a screening pelvic exam is right for you.  If you have had past treatment for cervical cancer or a condition that could lead to cancer, you need Pap tests and screening for cancer for at least 20 years after your treatment. If Pap tests have been discontinued, your risk factors (such as having a new sexual partner) need to be reassessed to determine if screening should resume. Some women have medical problems that increase the chance of getting cervical cancer. In these cases, your health care provider may recommend more frequent screening and Pap tests.  Colorectal Cancer  This type of cancer can be detected and often prevented.  Routine colorectal cancer screening usually begins at 42 years of age and continues through 43 years of age.  Your health care provider may recommend screening at an earlier age if you have risk factors  for colon cancer.  Your health care provider may also recommend using home test kits to check for hidden blood in the stool.  A small camera at the end of a tube can be used to examine your colon directly (sigmoidoscopy or colonoscopy). This is done to check for the earliest forms of colorectal cancer.  Routine screening usually begins at age 37.  Direct examination of the colon should be repeated every 5-10 years through 42 years of age. However, you may need to be screened more often if early forms of precancerous polyps or small growths are found.  Skin Cancer  Check your skin from head to toe regularly.  Tell your health care provider about any new moles or changes in moles, especially if there is a change in a mole's shape or color.  Also tell your health care provider if you have a mole that is larger than the size of a pencil eraser.  Always use sunscreen. Apply sunscreen liberally and repeatedly throughout the day.  Protect yourself by wearing long sleeves,  pants, a wide-brimmed hat, and sunglasses whenever you are outside.  Heart disease, diabetes, and high blood pressure  High blood pressure causes heart disease and increases the risk of stroke. High blood pressure is more likely to develop in: ? People who have blood pressure in the high end of the normal range (130-139/85-89 mm Hg). ? People who are overweight or obese. ? People who are African American.  If you are 18-39 years of age, have your blood pressure checked every 3-5 years. If you are 40 years of age or older, have your blood pressure checked every year. You should have your blood pressure measured twice-once when you are at a hospital or clinic, and once when you are not at a hospital or clinic. Record the average of the two measurements. To check your blood pressure when you are not at a hospital or clinic, you can use: ? An automated blood pressure machine at a pharmacy. ? A home blood pressure monitor.  If  you are between 55 years and 79 years old, ask your health care provider if you should take aspirin to prevent strokes.  Have regular diabetes screenings. This involves taking a blood sample to check your fasting blood sugar level. ? If you are at a normal weight and have a low risk for diabetes, have this test once every three years after 42 years of age. ? If you are overweight and have a high risk for diabetes, consider being tested at a younger age or more often. Preventing infection Hepatitis B  If you have a higher risk for hepatitis B, you should be screened for this virus. You are considered at high risk for hepatitis B if: ? You were born in a country where hepatitis B is common. Ask your health care provider which countries are considered high risk. ? Your parents were born in a high-risk country, and you have not been immunized against hepatitis B (hepatitis B vaccine). ? You have HIV or AIDS. ? You use needles to inject street drugs. ? You live with someone who has hepatitis B. ? You have had sex with someone who has hepatitis B. ? You get hemodialysis treatment. ? You take certain medicines for conditions, including cancer, organ transplantation, and autoimmune conditions.  Hepatitis C  Blood testing is recommended for: ? Everyone born from 1945 through 1965. ? Anyone with known risk factors for hepatitis C.  Sexually transmitted infections (STIs)  You should be screened for sexually transmitted infections (STIs) including gonorrhea and chlamydia if: ? You are sexually active and are younger than 42 years of age. ? You are older than 42 years of age and your health care provider tells you that you are at risk for this type of infection. ? Your sexual activity has changed since you were last screened and you are at an increased risk for chlamydia or gonorrhea. Ask your health care provider if you are at risk.  If you do not have HIV, but are at risk, it may be recommended  that you take a prescription medicine daily to prevent HIV infection. This is called pre-exposure prophylaxis (PrEP). You are considered at risk if: ? You are sexually active and do not regularly use condoms or know the HIV status of your partner(s). ? You take drugs by injection. ? You are sexually active with a partner who has HIV.  Talk with your health care provider about whether you are at high risk of being infected with HIV. If you   choose to begin PrEP, you should first be tested for HIV. You should then be tested every 3 months for as long as you are taking PrEP. Pregnancy  If you are premenopausal and you may become pregnant, ask your health care provider about preconception counseling.  If you may become pregnant, take 400 to 800 micrograms (mcg) of folic acid every day.  If you want to prevent pregnancy, talk to your health care provider about birth control (contraception). Osteoporosis and menopause  Osteoporosis is a disease in which the bones lose minerals and strength with aging. This can result in serious bone fractures. Your risk for osteoporosis can be identified using a bone density scan.  If you are 65 years of age or older, or if you are at risk for osteoporosis and fractures, ask your health care provider if you should be screened.  Ask your health care provider whether you should take a calcium or vitamin D supplement to lower your risk for osteoporosis.  Menopause may have certain physical symptoms and risks.  Hormone replacement therapy may reduce some of these symptoms and risks. Talk to your health care provider about whether hormone replacement therapy is right for you. Follow these instructions at home:  Schedule regular health, dental, and eye exams.  Stay current with your immunizations.  Do not use any tobacco products including cigarettes, chewing tobacco, or electronic cigarettes.  If you are pregnant, do not drink alcohol.  If you are  breastfeeding, limit how much and how often you drink alcohol.  Limit alcohol intake to no more than 1 drink per day for nonpregnant women. One drink equals 12 ounces of beer, 5 ounces of wine, or 1 ounces of hard liquor.  Do not use street drugs.  Do not share needles.  Ask your health care provider for help if you need support or information about quitting drugs.  Tell your health care provider if you often feel depressed.  Tell your health care provider if you have ever been abused or do not feel safe at home. This information is not intended to replace advice given to you by your health care provider. Make sure you discuss any questions you have with your health care provider. Document Released: 08/29/2010 Document Revised: 07/22/2015 Document Reviewed: 11/17/2014 Elsevier Interactive Patient Education  [employer].

## 2016-12-06 LAB — PAP, TP IMAGING W/ HPV RNA, RFLX HPV TYPE 16,18/45: HPV DNA High Risk: NOT DETECTED

## 2017-04-05 ENCOUNTER — Other Ambulatory Visit: Payer: Self-pay | Admitting: Family Medicine

## 2017-04-05 DIAGNOSIS — N6489 Other specified disorders of breast: Secondary | ICD-10-CM

## 2017-04-09 ENCOUNTER — Other Ambulatory Visit: Payer: Self-pay | Admitting: Family Medicine

## 2017-04-09 DIAGNOSIS — N6320 Unspecified lump in the left breast, unspecified quadrant: Secondary | ICD-10-CM

## 2017-04-11 ENCOUNTER — Ambulatory Visit
Admission: RE | Admit: 2017-04-11 | Discharge: 2017-04-11 | Disposition: A | Discharge status: Home / Self Care | Payer: BLUE CROSS/BLUE SHIELD | Source: Ambulatory Visit | Attending: Family Medicine | Admitting: Family Medicine

## 2017-04-11 DIAGNOSIS — N6320 Unspecified lump in the left breast, unspecified quadrant: Secondary | ICD-10-CM

## 2017-04-11 DIAGNOSIS — N6489 Other specified disorders of breast: Secondary | ICD-10-CM

## 2017-04-11 HISTORY — PX: BREAST BIOPSY: SHX20

## 2017-10-16 ENCOUNTER — Other Ambulatory Visit: Payer: Self-pay | Admitting: Family Medicine

## 2017-10-16 DIAGNOSIS — N6012 Diffuse cystic mastopathy of left breast: Secondary | ICD-10-CM

## 2017-10-22 ENCOUNTER — Ambulatory Visit: Payer: BLUE CROSS/BLUE SHIELD

## 2017-10-22 ENCOUNTER — Ambulatory Visit
Admission: RE | Admit: 2017-10-22 | Discharge: 2017-10-22 | Disposition: A | Discharge status: Home / Self Care | Payer: BLUE CROSS/BLUE SHIELD | Source: Ambulatory Visit | Attending: Family Medicine | Admitting: Family Medicine

## 2017-10-22 DIAGNOSIS — N6012 Diffuse cystic mastopathy of left breast: Secondary | ICD-10-CM

## 2017-10-30 ENCOUNTER — Other Ambulatory Visit: Payer: BLUE CROSS/BLUE SHIELD

## 2017-12-06 ENCOUNTER — Ambulatory Visit: Payer: BLUE CROSS/BLUE SHIELD | Admitting: Obstetrics & Gynecology

## 2018-01-21 ENCOUNTER — Telehealth: Payer: Self-pay | Admitting: *Deleted

## 2018-01-21 ENCOUNTER — Ambulatory Visit (INDEPENDENT_AMBULATORY_CARE_PROVIDER_SITE_OTHER): Payer: BLUE CROSS/BLUE SHIELD | Admitting: Obstetrics & Gynecology

## 2018-01-21 ENCOUNTER — Encounter: Payer: Self-pay | Admitting: Obstetrics & Gynecology

## 2018-01-21 VITALS — BP 124/80 | Ht 67.0 in | Wt 145.0 lb

## 2018-01-21 DIAGNOSIS — Z789 Other specified health status: Secondary | ICD-10-CM | POA: Diagnosis not present

## 2018-01-21 DIAGNOSIS — R002 Palpitations: Secondary | ICD-10-CM | POA: Diagnosis not present

## 2018-01-21 DIAGNOSIS — Z01419 Encounter for gynecological examination (general) (routine) without abnormal findings: Secondary | ICD-10-CM | POA: Diagnosis not present

## 2018-01-21 NOTE — Telephone Encounter (Signed)
Referral placed in epic/[facility] at [facility] they will call to schedule.

## 2018-01-21 NOTE — Progress Notes (Signed)
Leslie BambergerBrenda L Ford 05/13/1975 [id number]   History:    [age] y.o. G0  Married  RP:  Established patient presenting for annual gyn exam   HPI: Menstrual periods regular every 21-28 days, normal flow.  No BTB.  No pelvic pain.  Using condoms for contraception.  No pain with IC.  Breasts normal.  Urine/BMs wnl.  BMI 22.71.  Walks regularly.  C/O palpitations, especially at night.  No thoracic pain.  Health labs with Fam MD.  TSH normal recently per patient.  Past medical history,surgical history, family history and social history were all reviewed and documented in the EPIC chart.  Gynecologic History Patient's last menstrual period was 01/17/2018. Contraception: condoms Last Pap: 11/2016. Results were: Negative/HPV HR neg Last mammogram: 09/2017. Results were: Negative Bone Density: Never Colonoscopy: Never  Obstetric History OB History  Gravida Para Term Preterm AB Living  0 0 0 0 0 0  SAB TAB Ectopic Multiple Live Births  0 0 0 0 0     ROS: A ROS was performed and pertinent positives and negatives are included in the history.  GENERAL: No fevers or chills. HEENT: No change in vision, no earache, sore throat or sinus congestion. NECK: No pain or stiffness. CARDIOVASCULAR: No chest pain or pressure. No palpitations. PULMONARY: No shortness of breath, cough or wheeze. GASTROINTESTINAL: No abdominal pain, nausea, vomiting or diarrhea, melena or bright red blood per rectum. GENITOURINARY: No urinary frequency, urgency, hesitancy or dysuria. MUSCULOSKELETAL: No joint or muscle pain, no back pain, no recent trauma. DERMATOLOGIC: No rash, no itching, no lesions. ENDOCRINE: No polyuria, polydipsia, no heat or cold intolerance. No recent change in weight. HEMATOLOGICAL: No anemia or easy bruising or bleeding. NEUROLOGIC: No headache, seizures, numbness, tingling or weakness. PSYCHIATRIC: No depression, no loss of interest in normal activity or change in sleep pattern.     Exam:   BP 124/80 (BP  Location: Right Arm, Patient Position: Sitting, Cuff Size: Normal)   Ht 5\' 7"  (1.702 m)   Wt 145 lb (65.8 kg)   LMP 01/17/2018   BMI 22.71 kg/m   Body mass index is 22.71 kg/m.  General appearance : Well developed well nourished female. No acute distress HEENT: Eyes: no retinal hemorrhage or exudates,  Neck supple, trachea midline, no carotid bruits, no thyroidmegaly Lungs: Clear to auscultation, no rhonchi or wheezes, or rib retractions  Heart: Regular rate and rhythm, no murmurs or gallops Breast:Examined in sitting and supine position were symmetrical in appearance, no palpable masses or tenderness,  no skin retraction, no nipple inversion, no nipple discharge, no skin discoloration, no axillary or supraclavicular lymphadenopathy Abdomen: no palpable masses or tenderness, no rebound or guarding Extremities: no edema or skin discoloration or tenderness  Pelvic: Vulva: Normal             Vagina: No gross lesions or discharge  Cervix: No gross lesions or discharge.  Pap reflex done.  Uterus  AV, normal size, shape and consistency, non-tender and mobile  Adnexa  Without masses or tenderness  Anus: Normal   Assessment/Plan:  [age] y.o. female for annual exam   1. Encounter for routine gynecological examination with Papanicolaou smear of cervix Normal gynecologic exam.  Pap reflex done.  Breast exam normal.  Screening mammogram August 2019 was negative.  Health labs with family physician.  Good body mass index of 22.71.  Recommend aerobic physical activities 5 times a week and weightlifting every 2 days.  2. Uses condoms for contraception  3. Heart  palpitations Palpitations of the heart.  No other cardiac symptom.  Refer to cardiology for investigation.  Other orders - amphetamine-dextroamphetamine (ADDERALL) 10 MG tablet; as needed.  [name], 12:33 PM 01/21/2018

## 2018-01-21 NOTE — Patient Instructions (Signed)
1. Encounter for routine gynecological examination with Papanicolaou smear of cervix Normal gynecologic exam.  Pap reflex done.  Breast exam normal.  Screening mammogram August 2019 was negative.  Health labs with family physician.  Good body mass index of 22.71.  Recommend aerobic physical activities 5 times a week and weightlifting every 2 days.  2. Uses condoms for contraception  3. Heart palpitations Palpitations of the heart.  No other cardiac symptom.  Refer to cardiology for investigation.  Other orders - amphetamine-dextroamphetamine (ADDERALL) 10 MG tablet; as needed.  Leslie Ford, it was a pleasure seeing you today!  I will inform you of your results as soon as they are available.

## 2018-01-21 NOTE — Telephone Encounter (Signed)
-----   Message from Genia DelMarie-Lyne Lavoie, MD sent at 01/21/2018 12:51 PM EST ----- Regarding: Refer to Cardio Heart palpitations.  Refer to Cardio.

## 2018-01-22 LAB — PAP IG W/ RFLX HPV ASCU

## 2018-01-23 ENCOUNTER — Encounter: Payer: Self-pay | Admitting: *Deleted

## 2018-01-23 ENCOUNTER — Encounter: Payer: Self-pay | Admitting: Cardiovascular Disease

## 2018-01-23 ENCOUNTER — Ambulatory Visit (INDEPENDENT_AMBULATORY_CARE_PROVIDER_SITE_OTHER): Payer: BLUE CROSS/BLUE SHIELD | Admitting: Cardiovascular Disease

## 2018-01-23 VITALS — BP 92/56 | HR 83 | Ht 67.0 in | Wt 144.8 lb

## 2018-01-23 DIAGNOSIS — R002 Palpitations: Secondary | ICD-10-CM | POA: Diagnosis not present

## 2018-01-23 DIAGNOSIS — I341 Nonrheumatic mitral (valve) prolapse: Secondary | ICD-10-CM

## 2018-01-23 HISTORY — DX: Nonrheumatic mitral (valve) prolapse: I34.1

## 2018-01-23 HISTORY — DX: Palpitations: R00.2

## 2018-01-23 NOTE — Progress Notes (Signed)
Cardiology Office Note   Date:  01/23/2018   ID:  Leslie Ford, DOB 08/25/1975, MRN [id number]  PCP:  Leslie Ford  Cardiologist:   Leslie Ford   Chief Complaint  Patient presents with  . New Patient (Initial Visit)    pt c/o palpitations at night    History of Present Illness: Leslie Ford is a 42 y.o. female who is being seen today for the evaluation of palpitations  at the request of Leslie Ford.  Leslie Ford saw her OB/GYN, Leslie Ford, on 11/25 and reported palpitations.  No she reports palpitations that have been ongoing intermittently for the last several months.  It only tends to occur when she is lying in bed at night.  If letter is constantly until she is able to go to sleep.  She drinks 1 cup of coffee in the morning and no other caffeine throughout the day.  The only over-the-counter supplements she uses as carnitine.  She does use Adderall but takes this once or twice per month.  There does not seem to be any clear association with her palpitations.  The palpitations are not associated with lightheadedness, dizziness, chest pain, or shortness of breath.  She exercises 1-2 times per week and never has palpitations with exertion.  She denies any lightheadedness, dizziness, or presyncope.  She has not expands any lower extremity edema, orthopnea, or PND.  She was diagnosed with mitral valve prolapse 16 years ago.  She had an echocardiogram at that time and has not required any subsequent follow-up.  She was initially taking endocarditis prophylaxis but is not anymore.  She had labs checked recently with her PCP that were reportedly unremarkable.   Past Medical History:  Diagnosis Date  . Anxiety   . Mitral valve prolapse 01/23/2018  . Palpitations 01/23/2018  . Transverse myelitis Leslie Ford(HCC)     Past Surgical History:  Procedure Laterality Date  . BREAST BIOPSY Left 04/11/2017   benign  . TONSILLECTOMY AND ADENOIDECTOMY       Current  Outpatient Medications  Medication Sig Dispense Refill  . amphetamine-dextroamphetamine (ADDERALL) 10 MG tablet as needed.    . Multiple Vitamin (MULTIVITAMIN) tablet Take 1 tablet by mouth daily.       No current facility-administered medications for this visit.     Allergies:   Patient has no known allergies.    Social History:  The patient  reports that she has never smoked. She has never used smokeless tobacco. She reports that she does not drink alcohol or use drugs.   Family History:  The patient's family history includes Aneurysm in her paternal grandfather; Cancer in her maternal grandfather; Cancer (age of onset: 4848) in her father; Diabetes in her maternal grandmother; Heart attack in her maternal aunt; Hypertension in her mother; Stroke in her paternal grandfather.    ROS:  Please see the history of present illness.   Otherwise, review of systems are positive for none.   All other systems are reviewed and negative.    PHYSICAL EXAM: VS:  BP (!) 92/56   Pulse 83   Ht 5\' 7"  (1.702 m)   Wt 144 lb 12.8 oz (65.7 kg)   LMP 01/17/2018   BMI 22.68 kg/m   , BMI Body mass index is 22.68 kg/m. GENERAL:  Well appearing HEENT:  Pupils equal round and reactive, fundi not visualized, oral mucosa unremarkable NECK:  No jugular venous distention, waveform within normal limits, carotid upstroke brisk and  symmetric, no bruits, no thyromegaly LYMPHATICS:  No cervical adenopathy LUNGS:  Clear to auscultation bilaterally HEART:  RRR.  PMI not displaced or sustained,S1 and S2 within normal limits, no S3, no S4, no clicks, no rubs, no murmurs ABD:  Flat, positive bowel sounds normal in frequency in pitch, no bruits, no rebound, no guarding, no midline pulsatile mass, no hepatomegaly, no splenomegaly EXT:  2 plus pulses throughout, no edema, no cyanosis no clubbing SKIN:  No rashes no nodules NEURO:  Cranial nerves II through XII grossly intact, motor grossly intact throughout PSYCH:   Cognitively intact, oriented to person place and time    EKG:  EKG is ordered today. The ekg ordered today demonstrates sinus rhythm.  Rate 83 bpm.     Recent Labs: No results found for requested labs within last 8760 hours.    Lipid Panel    Component Value Date/Time   CHOL 165 11/29/2012 0945   TRIG 83 11/29/2012 0945   HDL 66 11/29/2012 0945   CHOLHDL 2.5 11/29/2012 0945   VLDL 17 11/29/2012 0945   LDLCALC 82 11/29/2012 0945      Wt Readings from Last 3 Encounters:  01/23/18 144 lb 12.8 oz (65.7 kg)  01/21/18 145 lb (65.8 kg)  12/05/16 147 lb (66.7 kg)      ASSESSMENT AND PLAN:  # Palpitations:  Ms. Manukyan's symptoms are atypical and only occur when lying in bed at night.  She thinks it may be related to being perimenopausal.  She has no exertional symptoms and it is not associated with any alarm symptoms such as dizziness or lightheadedness.  We discussed getting an ambulatory monitor now but she would like to wait and track the symptoms before doing so.She reportedly had labs recently checked with her PCP.  We will get a copy of these labs.  # Mitral valve prolapse: No murmur noted on exam and she has no evidence of heart failure.  We will not repeat an echocardiogram at this time.  We discussed the fact that endocarditis prophylaxis is no longer indicated.  Current medicines are reviewed at length with the patient today.  The patient does not have concerns regarding medicines.  The following changes have been made:  no change    Disposition:   FU with Camil Hausmann C. Duke Salvia, [facility], [facility] in 2 months.     Signed, Rainelle Sulewski C. Duke Salvia, Ford, [facility]  01/23/2018 3:09 PM    [facility]

## 2018-01-23 NOTE — Patient Instructions (Addendum)
Medication Instructions:   Your physician recommends that you continue on your current medications as directed. Please refer to the Current Medication list given to you today.  Labwork:  NONE  Testing/Procedures:  NONE  Follow-Up:  Your physician recommends that you schedule a follow-up appointment in: 2 months.  Any Other Special Instructions Will Be Listed Below (If Applicable).  Please keep documentation of your palpitations. Time, Symptoms and what you were doing at the time you felt it. Please bring this with you to your next visit.  If you need a refill on your cardiac medications before your next appointment, please call your pharmacy.

## 2018-01-28 NOTE — Telephone Encounter (Signed)
Appointment on 03/26/18 @ 8:00am with Dr. Duke Salviaandolph

## 2018-03-26 ENCOUNTER — Ambulatory Visit: Payer: BLUE CROSS/BLUE SHIELD | Admitting: Cardiovascular Disease

## 2018-09-24 ENCOUNTER — Other Ambulatory Visit: Payer: Self-pay | Admitting: Family Medicine

## 2018-09-24 DIAGNOSIS — Z1231 Encounter for screening mammogram for malignant neoplasm of breast: Secondary | ICD-10-CM

## 2018-11-11 ENCOUNTER — Ambulatory Visit
Admission: RE | Admit: 2018-11-11 | Discharge: 2018-11-11 | Disposition: A | Discharge status: Home / Self Care | Payer: BC Managed Care – PPO | Source: Ambulatory Visit | Attending: Family Medicine | Admitting: Family Medicine

## 2018-11-11 ENCOUNTER — Other Ambulatory Visit: Payer: Self-pay

## 2018-11-11 DIAGNOSIS — Z1231 Encounter for screening mammogram for malignant neoplasm of breast: Secondary | ICD-10-CM

## 2019-01-27 ENCOUNTER — Encounter: Payer: BLUE CROSS/BLUE SHIELD | Admitting: Obstetrics & Gynecology

## 2019-01-30 ENCOUNTER — Other Ambulatory Visit: Payer: Self-pay

## 2019-01-31 ENCOUNTER — Ambulatory Visit (INDEPENDENT_AMBULATORY_CARE_PROVIDER_SITE_OTHER): Payer: BC Managed Care – PPO | Admitting: Obstetrics & Gynecology

## 2019-01-31 ENCOUNTER — Encounter: Payer: Self-pay | Admitting: Obstetrics & Gynecology

## 2019-01-31 VITALS — BP 126/78 | Ht 67.0 in | Wt 148.0 lb

## 2019-01-31 DIAGNOSIS — Z789 Other specified health status: Secondary | ICD-10-CM

## 2019-01-31 DIAGNOSIS — Z01419 Encounter for gynecological examination (general) (routine) without abnormal findings: Secondary | ICD-10-CM | POA: Diagnosis not present

## 2019-01-31 DIAGNOSIS — N3090 Cystitis, unspecified without hematuria: Secondary | ICD-10-CM

## 2019-01-31 MED ORDER — NITROFURANTOIN MONOHYD MACRO 100 MG PO CAPS: 100.0000 mg | ORAL_CAPSULE | Freq: Every day | ORAL | 2 refills | Status: DC | PRN

## 2019-01-31 NOTE — Patient Instructions (Addendum)
1. Encounter for routine gynecological examination with Papanicolaou smear of cervix Normal gynecologic exam.  Pap reflex done.  Breast exam normal.  Screening mammogram September 2020 was negative.  Good body mass index at 23.18.  Aerobic physical activities 5 times a week and light weightlifting every 2 days recommended.  Fasting health labs here today. - CBC - Comprehensive metabolic panel - Lipid panel - TSH - VITAMIN D 25 Hydroxy (Vit-D Deficiency, Fractures)  2. Uses condoms for contraception  3. Recurrent cystitis Tendency for postcoital cystitis.  Precautions reviewed with patient, will continue to pass urine right after intercourse, hydrate well and use Azo as needed.  Decision to use nitrofurantoin prophylaxis.  Nitrofurantoin 100 mg/caps. 1 capsule before intercourse as needed.  Usage, benefits and risks reviewed and prescription sent to pharmacy.  Other orders - nitrofurantoin, macrocrystal-monohydrate, (MACROBID) 100 MG capsule; Take 1 capsule (100 mg total) by mouth daily as needed. Prophylaxis for Cystitis, take 1 tab PO before sexual activity as needed.  Leslie Ford, it was a pleasure seeing you today!  I will inform you of your results as soon as they are available.

## 2019-01-31 NOTE — Addendum Note (Signed)
Addended by: Thurnell Garbe A on: 01/31/2019 10:57 AM   Modules accepted: Orders

## 2019-01-31 NOTE — Progress Notes (Signed)
Leslie Ford 1975/06/30 [id number]   History:    43 y.o. G0  Married  RP:  Established patient presenting for annual gyn exam   HPI: Menstrual periods regular every 21-28 days, normal flow.  No BTB.  No pelvic pain.  Using condoms for contraception.  No pain with IC.  Frequent UTI Sxs post coitally.  2 Cystitis since last year.  Breasts normal.  Urine/BMs wnl.  BMI 23.18.  Walks regularly.  Health labs here today.   Past medical history,surgical history, family history and social history were all reviewed and documented in the EPIC chart.  Gynecologic History Patient's last menstrual period was 01/19/2019. Contraception: condoms Last Pap: 12/2017. Results were: Negative Last mammogram: 10/2018. Results were: Negative Bone Density: Never Colonoscopy: Never  Obstetric History OB History  Gravida Para Term Preterm AB Living  0 0 0 0 0 0  SAB TAB Ectopic Multiple Live Births  0 0 0 0 0     ROS: A ROS was performed and pertinent positives and negatives are included in the history.  GENERAL: No fevers or chills. HEENT: No change in vision, no earache, sore throat or sinus congestion. NECK: No pain or stiffness. CARDIOVASCULAR: No chest pain or pressure. No palpitations. PULMONARY: No shortness of breath, cough or wheeze. GASTROINTESTINAL: No abdominal pain, nausea, vomiting or diarrhea, melena or bright red blood per rectum. GENITOURINARY: No urinary frequency, urgency, hesitancy or dysuria. MUSCULOSKELETAL: No joint or muscle pain, no back pain, no recent trauma. DERMATOLOGIC: No rash, no itching, no lesions. ENDOCRINE: No polyuria, polydipsia, no heat or cold intolerance. No recent change in weight. HEMATOLOGICAL: No anemia or easy bruising or bleeding. NEUROLOGIC: No headache, seizures, numbness, tingling or weakness. PSYCHIATRIC: No depression, no loss of interest in normal activity or change in sleep pattern.     Exam:   BP 126/78   Ht 5\' 7"  (1.702 m)   Wt 148 lb (67.1  kg)   LMP 01/19/2019   BMI 23.18 kg/m   Body mass index is 23.18 kg/m.  General appearance : Well developed well nourished female. No acute distress HEENT: Eyes: no retinal hemorrhage or exudates,  Neck supple, trachea midline, no carotid bruits, no thyroidmegaly Lungs: Clear to auscultation, no rhonchi or wheezes, or rib retractions  Heart: Regular rate and rhythm, no murmurs or gallops Breast:Examined in sitting and supine position were symmetrical in appearance, no palpable masses or tenderness,  no skin retraction, no nipple inversion, no nipple discharge, no skin discoloration, no axillary or supraclavicular lymphadenopathy Abdomen: no palpable masses or tenderness, no rebound or guarding Extremities: no edema or skin discoloration or tenderness  Pelvic: Vulva: Normal             Vagina: No gross lesions or discharge  Cervix: No gross lesions or discharge.  Pap reflex done.  Uterus  AV, normal size, shape and consistency, non-tender and mobile  Adnexa  Without masses or tenderness  Anus: Normal   Assessment/Plan:  43 y.o. female for annual exam   1. Encounter for routine gynecological examination with Papanicolaou smear of cervix Normal gynecologic exam.  Pap reflex done.  Breast exam normal.  Screening mammogram September 2020 was negative.  Good body mass index at 23.18.  Aerobic physical activities 5 times a week and light weightlifting every 2 days recommended.  Fasting health labs here today. - CBC - Comprehensive metabolic panel - Lipid panel - TSH - VITAMIN D 25 Hydroxy (Vit-D Deficiency, Fractures)  2. Uses condoms for  contraception  3. Recurrent cystitis Tendency for postcoital cystitis.  Precautions reviewed with patient, will continue to pass urine right after intercourse, hydrate well and use Azo as needed.  Decision to use nitrofurantoin prophylaxis.  Nitrofurantoin 100 mg/caps. 1 capsule before intercourse as needed.  Usage, benefits and risks reviewed and  prescription sent to pharmacy.  Other orders - nitrofurantoin, macrocrystal-monohydrate, (MACROBID) 100 MG capsule; Take 1 capsule (100 mg total) by mouth daily as needed. Prophylaxis for Cystitis, take 1 tab PO before sexual activity as needed.  Princess Bruins MD, 8:19 AM 01/31/2019

## 2019-02-01 LAB — COMPREHENSIVE METABOLIC PANEL
AG Ratio: 2 (calc) (ref 1.0–2.5)
ALT: 10 U/L (ref 6–29)
AST: 10 U/L (ref 10–30)
Albumin: 4.6 g/dL (ref 3.6–5.1)
Alkaline phosphatase (APISO): 58 U/L (ref 31–125)
BUN: 10 mg/dL (ref 7–25)
CO2: 27 mmol/L (ref 20–32)
Calcium: 9.3 mg/dL (ref 8.6–10.2)
Chloride: 104 mmol/L (ref 98–110)
Creat: 0.68 mg/dL (ref 0.50–1.10)
Globulin: 2.3 g/dL (calc) (ref 1.9–3.7)
Glucose, Bld: 90 mg/dL (ref 65–99)
Potassium: 4.2 mmol/L (ref 3.5–5.3)
Sodium: 138 mmol/L (ref 135–146)
Total Bilirubin: 0.6 mg/dL (ref 0.2–1.2)
Total Protein: 6.9 g/dL (ref 6.1–8.1)

## 2019-02-01 LAB — LIPID PANEL
Cholesterol: 186 mg/dL (ref <200)
HDL: 72 mg/dL (ref >=50)
LDL Cholesterol (Calc): 97 mg/dL (calc)
Non-HDL Cholesterol (Calc): 114 mg/dL (calc) (ref <130)
Total CHOL/HDL Ratio: 2.6 (calc) (ref <5.0)
Triglycerides: 82 mg/dL (ref <150)

## 2019-02-01 LAB — CBC
HCT: 34 % — ABNORMAL LOW (ref 35.0–45.0)
Hemoglobin: 10.9 g/dL — ABNORMAL LOW (ref 11.7–15.5)
MCH: 28.1 pg (ref 27.0–33.0)
MCHC: 32.1 g/dL (ref 32.0–36.0)
MCV: 87.6 fL (ref 80.0–100.0)
MPV: 12.4 fL (ref 7.5–12.5)
Platelets: 216 10*3/uL (ref 140–400)
RBC: 3.88 10*6/uL (ref 3.80–5.10)
RDW: 12.9 % (ref 11.0–15.0)
WBC: 5.2 10*3/uL (ref 3.8–10.8)

## 2019-02-01 LAB — VITAMIN D 25 HYDROXY (VIT D DEFICIENCY, FRACTURES): Vit D, 25-Hydroxy: 39 ng/mL (ref 30–100)

## 2019-02-01 LAB — TSH: TSH: 2.13 mIU/L

## 2019-02-04 LAB — PAP IG W/ RFLX HPV ASCU

## 2019-02-04 LAB — HUMAN PAPILLOMAVIRUS, HIGH RISK: HPV DNA High Risk: NOT DETECTED

## 2019-10-15 ENCOUNTER — Other Ambulatory Visit: Payer: Self-pay | Admitting: Family Medicine

## 2019-10-15 DIAGNOSIS — Z1231 Encounter for screening mammogram for malignant neoplasm of breast: Secondary | ICD-10-CM

## 2019-11-12 ENCOUNTER — Other Ambulatory Visit: Payer: Self-pay

## 2019-11-12 ENCOUNTER — Ambulatory Visit
Admission: RE | Admit: 2019-11-12 | Discharge: 2019-11-12 | Disposition: A | Discharge status: Home / Self Care | Payer: BC Managed Care – PPO | Source: Ambulatory Visit | Attending: Family Medicine | Admitting: Family Medicine

## 2019-11-12 DIAGNOSIS — Z1231 Encounter for screening mammogram for malignant neoplasm of breast: Secondary | ICD-10-CM

## 2019-12-08 ENCOUNTER — Ambulatory Visit
Admission: RE | Admit: 2019-12-08 | Discharge: 2019-12-08 | Disposition: A | Discharge status: Home / Self Care | Payer: BC Managed Care – PPO | Source: Ambulatory Visit | Attending: Family Medicine | Admitting: Family Medicine

## 2019-12-08 ENCOUNTER — Other Ambulatory Visit: Payer: Self-pay

## 2020-01-14 ENCOUNTER — Institutional Professional Consult (permissible substitution): Payer: BC Managed Care – PPO | Admitting: Plastic Surgery

## 2020-05-03 ENCOUNTER — Telehealth: Payer: Self-pay | Admitting: *Deleted

## 2020-05-03 NOTE — Telephone Encounter (Signed)
Patient of Dr.Lavoie called to report irregular cycle questioned if related to covid booster. Patient received her covid booster on 04/16/20,then LMP:04/17/20 reports this cycle came about 4-5 days earlier ( this has happened before,but normally about 2-3 day earlier) this cycle was normal flow last about 5/6 days. Then another cycle started yesterday was spotting,but now a full flow. Not heavy, but does have small clots, this cycle she noticed breast tenderness and "ovary cramping" patient said the cramping/breast tenderness has now stopped.  She called asking if this could be possibility related to the booster vaccine? She did read online it could be related. Please advise

## 2020-05-03 NOTE — Telephone Encounter (Signed)
I suspect her irregular bleeding has more to do with her age than the covid vaccination. It is not uncommon for women to have some irregular bleeding in their 40's. Stress can also cause menstrual changes. I would have her calendar her bleeding and call if this is a recurring problem.

## 2020-05-04 NOTE — Telephone Encounter (Signed)
Patient informed with below note. 

## 2020-10-25 ENCOUNTER — Other Ambulatory Visit: Payer: Self-pay | Admitting: Family Medicine

## 2020-10-25 DIAGNOSIS — Z1231 Encounter for screening mammogram for malignant neoplasm of breast: Secondary | ICD-10-CM

## 2020-12-08 ENCOUNTER — Ambulatory Visit: Payer: BC Managed Care – PPO

## 2021-01-08 IMAGING — MG DIGITAL SCREENING BILAT W/ TOMO W/ CAD
8 series · 9 of 24 positions shown · non-contrast
Comparison: Previous exam(s).

CLINICAL DATA: Screening.

EXAM:
DIGITAL SCREENING BILATERAL MAMMOGRAM WITH TOMO AND CAD

[L MLO synth-2D]
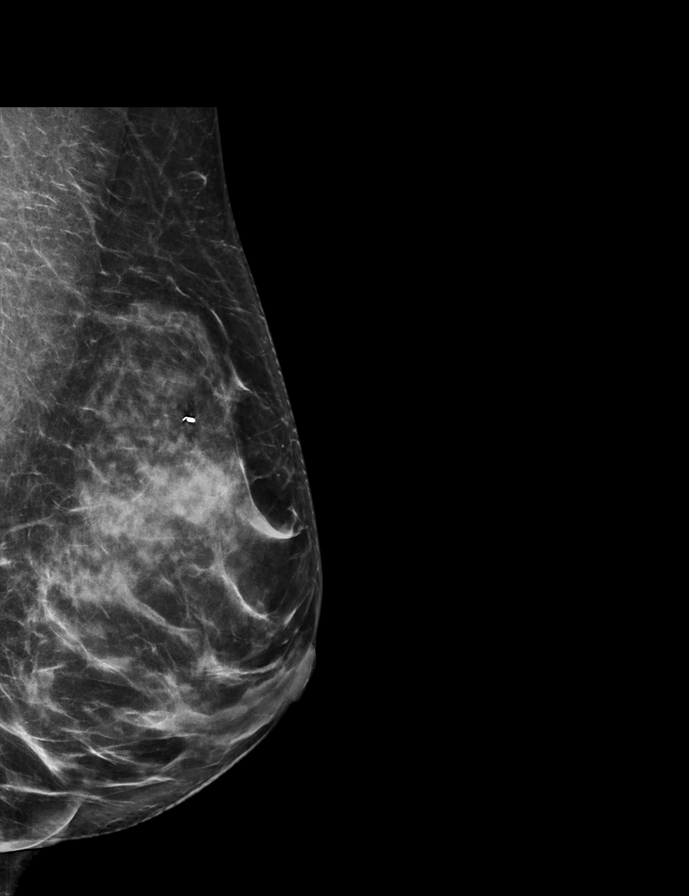

[R CC synth-2D]
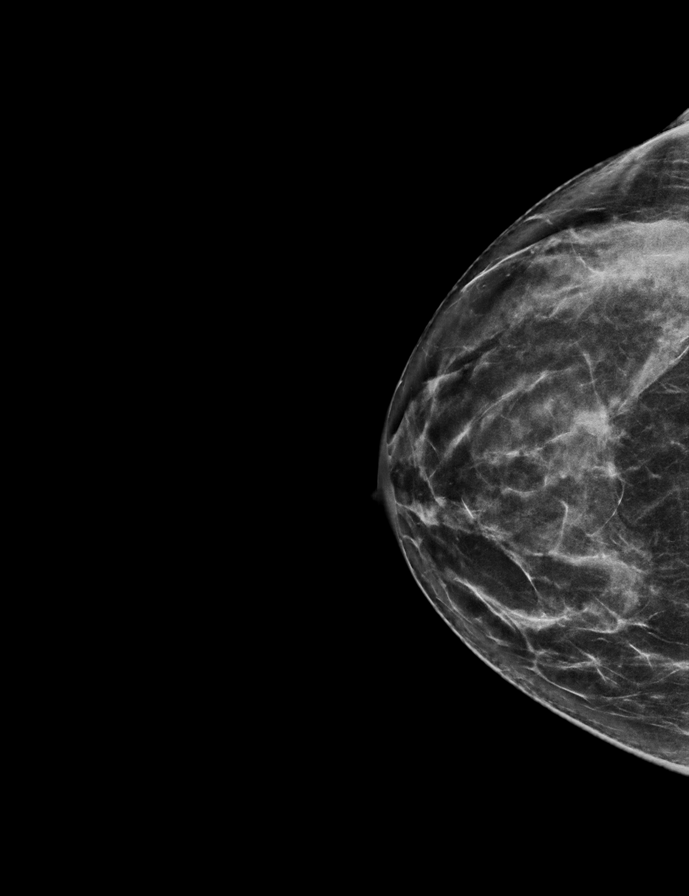

[R MLO synth-2D]
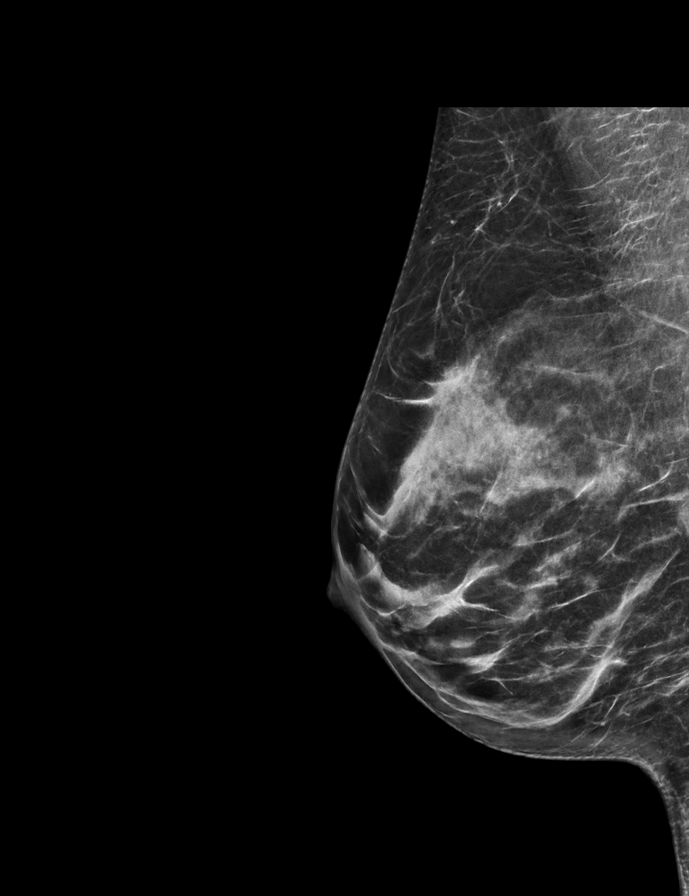

[L CC synth-2D]
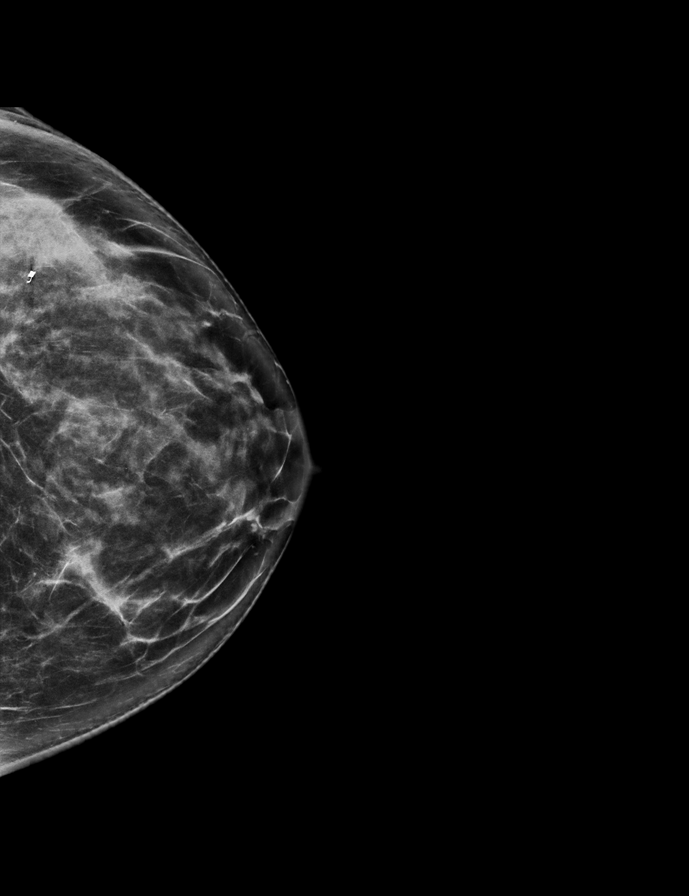

[L CC tomo · 2 of 69 frames shown]
[frame 23/69]
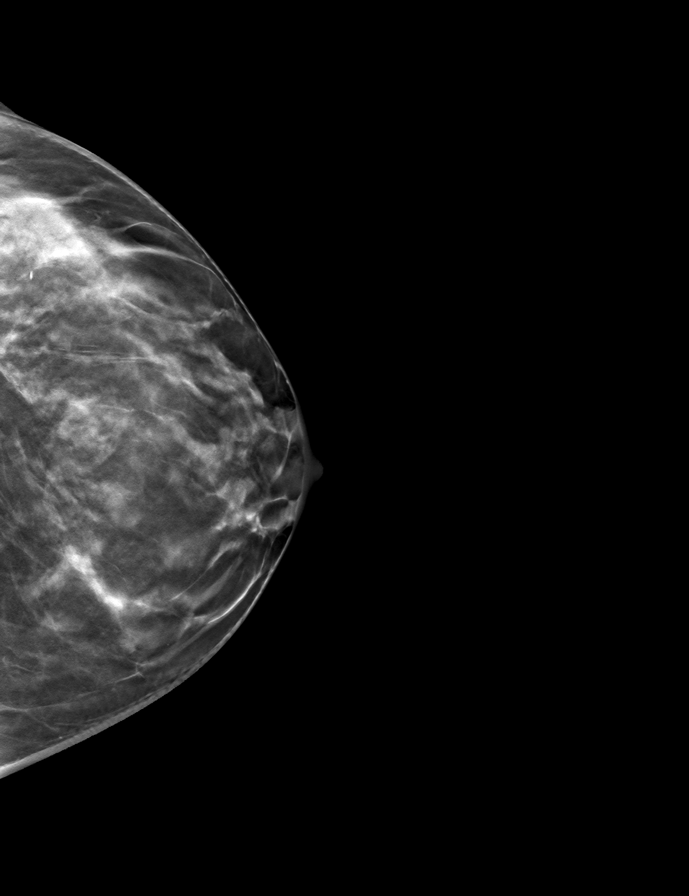
[frame 35/69]
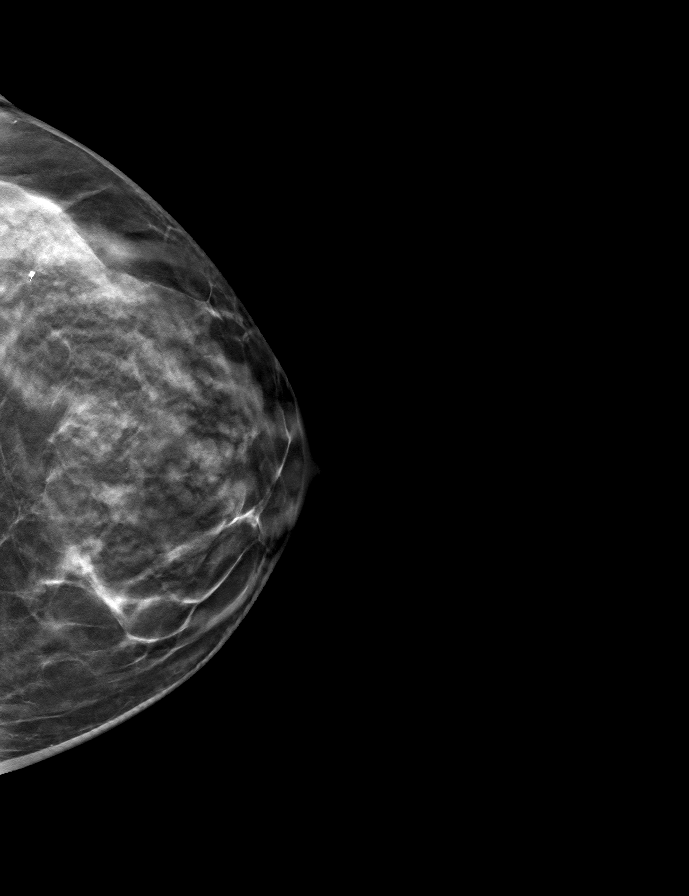

[L MLO tomo · tomo slice 37/72.0]
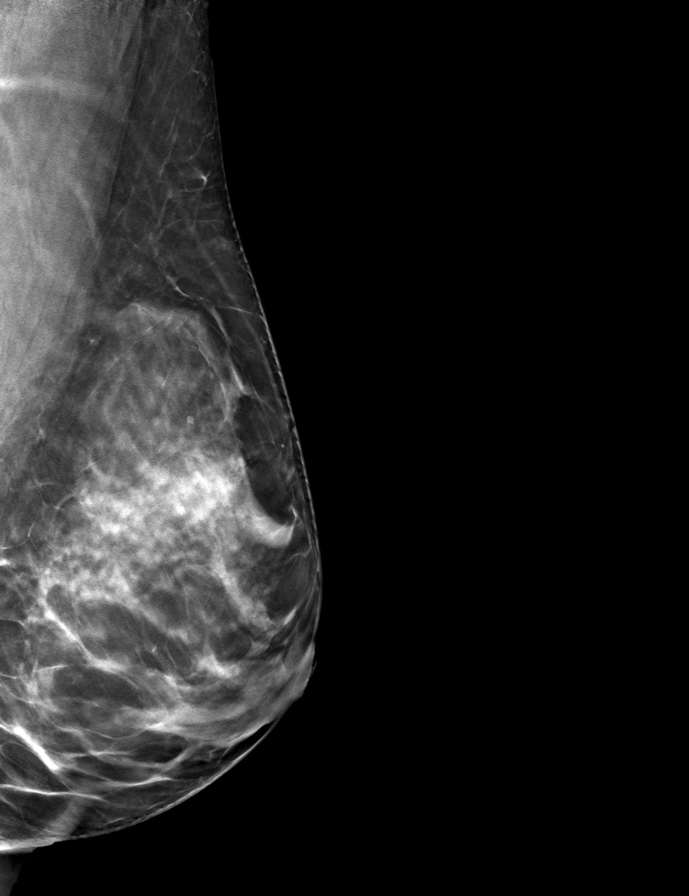

[R MLO tomo · tomo slice 31/62.0]
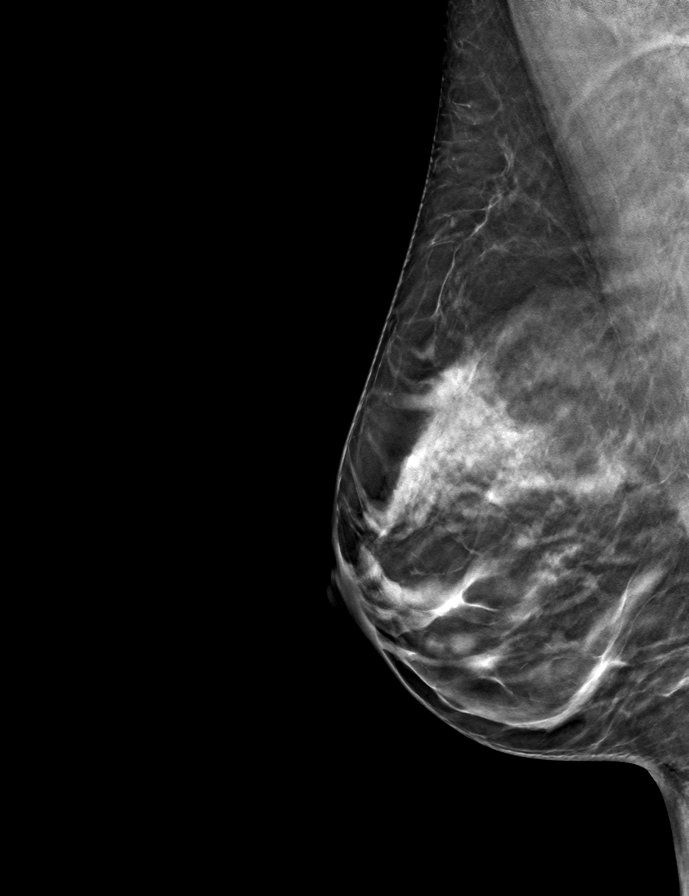

[R CC tomo · tomo slice 33/66.0]
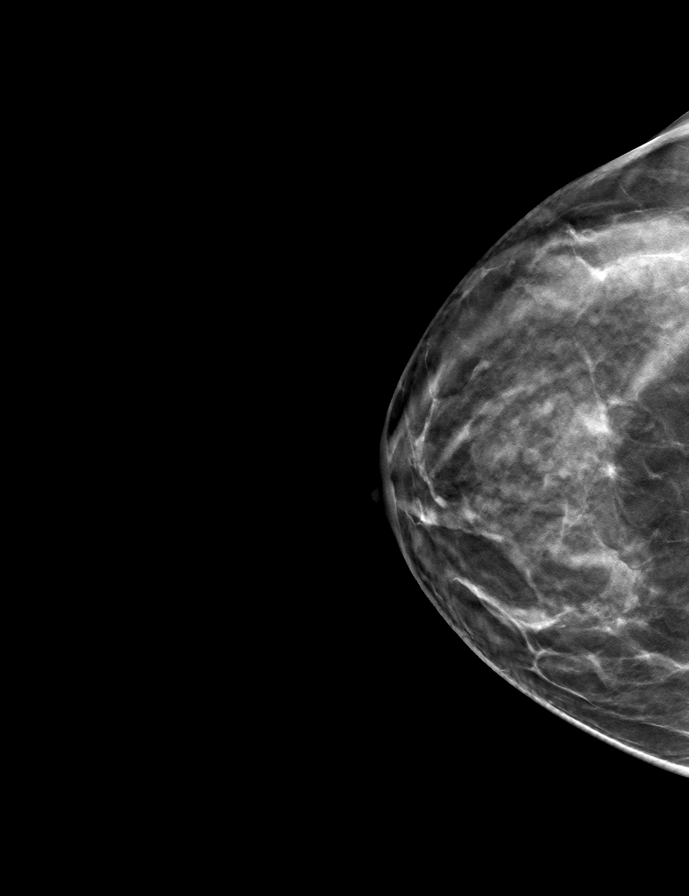

[9 of 24 positions shown; findings below may reference images not displayed]

ACR Breast Density Category c: The breast tissue is heterogeneously
dense, which may obscure small masses.
FINDINGS: There are no findings suspicious for malignancy. Images were
processed with CAD.
IMPRESSION: No mammographic evidence of malignancy. A result letter of this
screening mammogram will be mailed directly to the patient.

RECOMMENDATION:
Screening mammogram in one year. (Code:FT-U-LHB)

BI-RADS CATEGORY  1: Negative.

## 2021-03-07 ENCOUNTER — Ambulatory Visit (INDEPENDENT_AMBULATORY_CARE_PROVIDER_SITE_OTHER): Payer: BC Managed Care – PPO | Admitting: Obstetrics & Gynecology

## 2021-03-07 ENCOUNTER — Encounter: Payer: Self-pay | Admitting: Obstetrics & Gynecology

## 2021-03-07 ENCOUNTER — Other Ambulatory Visit: Payer: Self-pay

## 2021-03-07 VITALS — BP 136/70 | HR 106

## 2021-03-07 DIAGNOSIS — R102 Pelvic and perineal pain: Secondary | ICD-10-CM

## 2021-03-07 NOTE — Progress Notes (Signed)
° ° °  Leslie Ford Jul 23, 1975 [id number]        46 y.o.  G0P0000 Accompanied by husband.  RP: Intermittent Rt pelvic pain  HPI: Intermittent Rt pelvic pain.  Usually midcycle with h/o ovulatory pain/cyst.  The pain is now much less, but little twitches here and there.  Dysmenorrhea was more severe this cycle, which started on 03/02/2021.  No pain with IC.  Using condoms.  No vaginal d/c.  No BTB.  No fever.  No UTI Sx.  BMs normal.      OB History  Gravida Para Term Preterm AB Living  0 0 0 0 0 0  SAB IAB Ectopic Multiple Live Births  0 0 0 0 0    Past medical history,surgical history, problem list, medications, allergies, family history and social history were all reviewed and documented in the EPIC chart.   Directed ROS with pertinent positives and negatives documented in the history of present illness/assessment and plan.  Exam:  Vitals:   03/07/21 0946  BP: 136/70  Pulse: (!) 106  SpO2: 99%   General appearance:  Normal  Abdomen: Normal  Gynecologic exam: Vulva normal.  Bimanual exam: Uterus AV normal volume, mobile, Adnexa no mass/cyst felt, NT Bilaterally.   Assessment/Plan:  46 y.o. G0  1. Pelvic pain in female Intermittent Rt pelvic pain.  Usually midcycle with h/o ovulatory pain/cyst.  The pain is now much less, but little twitches here and there.  Dysmenorrhea was more severe this cycle, which started on 03/02/2021.  No pain with IC.  Using condoms.  No vaginal d/c.  No BTB.  No fever.  No UTI Sx.  BMs normal.    Pelvic exam normal today.  Will complete the investigation with a Pelvic US at f/u.  If a cyst of ovulation is likely the cause of the symptoms, will consider a Progestin only pill.  Counseling on the Progestin pill and management of Ovulatory Cysts/Pain discussed. - US Transvaginal Non-OB; Future   [name], 10:20 AM 03/07/2021

## 2021-03-17 ENCOUNTER — Other Ambulatory Visit: Payer: Self-pay

## 2021-03-17 ENCOUNTER — Encounter: Payer: Self-pay | Admitting: Obstetrics & Gynecology

## 2021-03-17 ENCOUNTER — Ambulatory Visit (INDEPENDENT_AMBULATORY_CARE_PROVIDER_SITE_OTHER): Payer: BC Managed Care – PPO | Admitting: Obstetrics & Gynecology

## 2021-03-17 ENCOUNTER — Ambulatory Visit (INDEPENDENT_AMBULATORY_CARE_PROVIDER_SITE_OTHER): Payer: BC Managed Care – PPO

## 2021-03-17 VITALS — BP 116/78

## 2021-03-17 DIAGNOSIS — R102 Pelvic and perineal pain: Secondary | ICD-10-CM

## 2021-03-17 NOTE — Progress Notes (Signed)
° ° °  Leslie Ford 1976/01/20 [id number]        46 y.o.  G0 Married   RP: Intermittent Rt pelvic pain for Pelvic US   HPI: Intermittent Rt pelvic pain.  Usually midcycle with h/o ovulatory pain/cyst.  No pain currently.  Dysmenorrhea was more severe last cycle, which started on 03/02/2021.  No pain with IC.  Using condoms.  No vaginal d/c.  No BTB.  No fever.  No UTI Sx.  BMs normal.      OB History  Gravida Para Term Preterm AB Living  0 0 0 0 0 0  SAB IAB Ectopic Multiple Live Births  0 0 0 0 0    Past medical history,surgical history, problem list, medications, allergies, family history and social history were all reviewed and documented in the EPIC chart.   Directed ROS with pertinent positives and negatives documented in the history of present illness/assessment and plan.  Exam:  Vitals:   03/17/21 0851  BP: 116/78   General appearance:  Normal  Pelvic US today: T/V images.  Retroverted uterus normal in size and shape with no myometrial mass.  The uterus is measured at 8.54 x 4.06 x 4.75 cm.  Symmetrical endometrial lining measured at 11.3 mm with trace amount of fluid seen within the cavity.  No mass or feeder vessels seen in the endometrium.  Both ovaries are mobile, normal in size with normal follicular pattern and normal perfusion.  Resolving corpus luteum cyst seen on the left ovary.  No adnexal mass.  Small amount of free fluid seen bilaterally at the adnexa.   Assessment/Plan:  47 y.o. G0   1. 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  Usually midcycle with h/o ovulatory pain/cyst.  No pain currently.  Dysmenorrhea was more severe last cycle, which started on 03/02/2021.  No pain with IC.  Using condoms.  No vaginal d/c.  No BTB.  No fever.  No UTI Sx.  BMs normal.   Pelvic US findings thoroughly reviewed.  Normal uterus/endometrium and bilateral ovaries.  No adnexal mass.  Probably ovulatory pains.  Possible cyclic intestinal pains.  Counseling on the  Progestin pill was done.  Decision to observe for about 3 months.  If the pain recurs at mid cycle and is severe, will call to be started on the Progestin pill.  [name], 9:03 AM 03/17/2021

## 2021-03-24 ENCOUNTER — Other Ambulatory Visit: Payer: BC Managed Care – PPO | Admitting: Obstetrics & Gynecology

## 2021-03-24 ENCOUNTER — Other Ambulatory Visit: Payer: BC Managed Care – PPO

## 2021-09-29 ENCOUNTER — Ambulatory Visit
Admission: RE | Admit: 2021-09-29 | Discharge: 2021-09-29 | Disposition: A | Discharge status: Home / Self Care | Payer: BC Managed Care – PPO | Source: Ambulatory Visit | Attending: Family Medicine | Admitting: Family Medicine

## 2021-09-29 DIAGNOSIS — Z1231 Encounter for screening mammogram for malignant neoplasm of breast: Secondary | ICD-10-CM

## 2021-10-03 ENCOUNTER — Ambulatory Visit (INDEPENDENT_AMBULATORY_CARE_PROVIDER_SITE_OTHER): Payer: BC Managed Care – PPO | Admitting: Obstetrics & Gynecology

## 2021-10-03 ENCOUNTER — Other Ambulatory Visit (HOSPITAL_COMMUNITY)
Admission: RE | Admit: 2021-10-03 | Discharge: 2021-10-03 | Disposition: A | Discharge status: Home / Self Care | Payer: BC Managed Care – PPO | Source: Ambulatory Visit | Attending: Obstetrics & Gynecology | Admitting: Obstetrics & Gynecology

## 2021-10-03 ENCOUNTER — Encounter: Payer: Self-pay | Admitting: Obstetrics & Gynecology

## 2021-10-03 VITALS — BP 116/62 | HR 80 | Resp 12 | Ht 67.0 in | Wt 154.0 lb

## 2021-10-03 DIAGNOSIS — Z789 Other specified health status: Secondary | ICD-10-CM

## 2021-10-03 DIAGNOSIS — Z01419 Encounter for gynecological examination (general) (routine) without abnormal findings: Secondary | ICD-10-CM | POA: Diagnosis not present

## 2021-10-03 DIAGNOSIS — N3090 Cystitis, unspecified without hematuria: Secondary | ICD-10-CM | POA: Diagnosis not present

## 2021-10-03 DIAGNOSIS — R300 Dysuria: Secondary | ICD-10-CM

## 2021-10-03 LAB — URINALYSIS, COMPLETE W/RFL CULTURE
Protein, ur: NEGATIVE
pH: 6.5 (ref 5.0–8.0)

## 2021-10-03 MED ORDER — NITROFURANTOIN MONOHYD MACRO 100 MG PO CAPS: 100.0000 mg | ORAL_CAPSULE | Freq: Every day | ORAL | 3 refills | Status: AC

## 2021-10-03 NOTE — Progress Notes (Signed)
Leslie Ford 1976-01-11 [id number]   History:    46 y.o. G0  Married   RP:  Established patient presenting for annual gyn exam    HPI: Menstrual periods regular every 21-33 days, normal flow.  No BTB.  No pelvic pain.  Using condoms for contraception.  No pain with IC.  Pap Neg in 2022 at [facility].  Frequent UTI Sxs post coitally.  3 Cystitis in one year.  Just finished a treatment with MacroBID.  Breasts normal.  Mammo 09/2021 Neg. Urine/BMs wnl.  BMI 24.12.  Walks regularly.  Health labs with Fam MD.   Past medical history,surgical history, family history and social history were all reviewed and documented in the EPIC chart.  Gynecologic History Patient's last menstrual period was 09/15/2021.  Obstetric History OB History  Gravida Para Term Preterm AB Living  0 0 0 0 0 0  SAB IAB Ectopic Multiple Live Births  0 0 0 0 0     ROS: A ROS was performed and pertinent positives and negatives are included in the history. GENERAL: No fevers or chills. HEENT: No change in vision, no earache, sore throat or sinus congestion. NECK: No pain or stiffness. CARDIOVASCULAR: No chest pain or pressure. No palpitations. PULMONARY: No shortness of breath, cough or wheeze. GASTROINTESTINAL: No abdominal pain, nausea, vomiting or diarrhea, melena or bright red blood per rectum. GENITOURINARY: No urinary frequency, urgency, hesitancy or dysuria. MUSCULOSKELETAL: No joint or muscle pain, no back pain, no recent trauma. DERMATOLOGIC: No rash, no itching, no lesions. ENDOCRINE: No polyuria, polydipsia, no heat or cold intolerance. No recent change in weight. HEMATOLOGICAL: No anemia or easy bruising or bleeding. NEUROLOGIC: No headache, seizures, numbness, tingling or weakness. PSYCHIATRIC: No depression, no loss of interest in normal activity or change in sleep pattern.     Exam:   BP 116/62   Pulse 80   Resp 12   Ht 5\' 7"  (1.702 m)   Wt 154 lb (69.9 kg)   LMP 09/15/2021   BMI 24.12 kg/m   Body  mass index is 24.12 kg/m.  General appearance : Well developed well nourished female. No acute distress HEENT: Eyes: no retinal hemorrhage or exudates,  Neck supple, trachea midline, no carotid bruits, no thyroidmegaly Lungs: Clear to auscultation, no rhonchi or wheezes, or rib retractions  Heart: Regular rate and rhythm, no murmurs or gallops Breast:Examined in sitting and supine position were symmetrical in appearance, no palpable masses or tenderness,  no skin retraction, no nipple inversion, no nipple discharge, no skin discoloration, no axillary or supraclavicular lymphadenopathy Abdomen: no palpable masses or tenderness, no rebound or guarding Extremities: no edema or skin discoloration or tenderness  Pelvic: Vulva: Normal             Vagina: No gross lesions or discharge  Cervix: No gross lesions or discharge.  Pap reflex done.  Uterus  AV, normal size, shape and consistency, non-tender and mobile  Adnexa  Without masses or tenderness  Anus: Normal  U/A: Yellow clear, Nit Neg, WBC 6-10, RBC 0-2, Bacteria Few.  U. Culture pending.   Assessment/Plan:  46 y.o. female for annual exam   1. Encounter for routine gynecological examination with Papanicolaou smear of cervix Menstrual periods regular every 21-33 days, normal flow.  No BTB.  No pelvic pain.  Using condoms for contraception.  No pain with IC.  Pap Neg in 2022 at [facility].  Frequent UTI Sxs post coitally.  3 Cystitis in one year.  Just  finished a treatment with MacroBID.  Breasts normal.  Mammo 09/2021 Neg. Urine/BMs wnl.  BMI 24.12.  Walks regularly.  Health labs with Fam MD. - Cytology - PAP( [place])  2. Uses condoms for contraception  3. Burning with urination Just finished a treatment with MacroBID.  U/A minimally perturbed.  Will wait on U. Culture. - Urinalysis,Complete w/RFL Culture  4. Recurrent cystitis Post coital cystitis.  Nitrofurantoin 100 gm 1 tab PO prior to sexual activity as needed.  Other  orders - Urine Culture - REFLEXIVE URINE CULTURE - nitrofurantoin, macrocrystal-monohydrate, (MACROBID) 100 MG capsule; Take 1 capsule (100 mg total) by mouth daily. Prophylaxis 1 tab PO prior to sexual activity.   [name], 4:27 PM 10/03/2021

## 2021-10-04 ENCOUNTER — Encounter: Payer: Self-pay | Admitting: Obstetrics & Gynecology

## 2021-10-05 LAB — URINALYSIS, COMPLETE W/RFL CULTURE
Bilirubin Urine: NEGATIVE
Glucose, UA: NEGATIVE
Hyaline Cast: NONE SEEN /LPF
Nitrites, Initial: NEGATIVE
Specific Gravity, Urine: 1.02 (ref 1.001–1.035)

## 2021-10-05 LAB — URINE CULTURE
MICRO NUMBER:: 13744372
SPECIMEN QUALITY:: ADEQUATE

## 2021-10-05 LAB — CULTURE INDICATED

## 2021-10-06 LAB — CYTOLOGY - PAP
Diagnosis: NEGATIVE
Diagnosis: REACTIVE

## 2021-10-07 MED ORDER — SULFAMETHOXAZOLE-TRIMETHOPRIM 800-160 MG PO TABS: ORAL_TABLET | ORAL | 3 refills | Status: DC

## 2021-10-07 NOTE — Telephone Encounter (Signed)
Dr.Lavoie replied " U. Culture No Uropathogen.     Patient will take prophylaxis before intercourse.  You can send her Bactrim DS #[id number], refill x 3.  Hopefully it will be less expensive.

## 2022-07-28 ENCOUNTER — Telehealth: Payer: Self-pay

## 2022-07-28 ENCOUNTER — Ambulatory Visit (INDEPENDENT_AMBULATORY_CARE_PROVIDER_SITE_OTHER): Payer: BC Managed Care – PPO | Admitting: Obstetrics & Gynecology

## 2022-07-28 ENCOUNTER — Encounter: Payer: Self-pay | Admitting: Obstetrics & Gynecology

## 2022-07-28 VITALS — BP 108/64 | HR 86

## 2022-07-28 DIAGNOSIS — N926 Irregular menstruation, unspecified: Secondary | ICD-10-CM | POA: Diagnosis not present

## 2022-07-28 LAB — CBC
HCT: 36.1 % (ref 35.0–45.0)
MCH: 29.5 pg (ref 27.0–33.0)
MCV: 90.9 fL (ref 80.0–100.0)
MPV: 12.5 fL (ref 7.5–12.5)
RBC: 3.97 10*6/uL (ref 3.80–5.10)
WBC: 6 10*3/uL (ref 3.8–10.8)

## 2022-07-28 LAB — PREGNANCY, URINE: Preg Test, Ur: NEGATIVE

## 2022-07-28 MED ORDER — NORETHINDRONE 0.35 MG PO TABS: 1.0000 | ORAL_TABLET | Freq: Every day | ORAL | 4 refills | Status: AC

## 2022-07-28 NOTE — Telephone Encounter (Signed)
-----   Message from [facility] sent at 07/28/2022 10:58 AM EDT ----- Pt is requesting to have u/s sooner at [facility]

## 2022-07-28 NOTE — Progress Notes (Signed)
    Leslie Ford 1975-11-13 [id number]        47 y.o.  G0 Married  RP: Breakthrough bleeding x last menstrual period  HPI: Breakthrough bleeding x last menstrual period.  LMP 07/13/22.  Menses/BTB every 14-30 days in the last year.  No Pelvic pain.  No vaginal discharge. Sexually active, using condoms. Declines STI screen.  UPT today Neg.   OB History  Gravida Para Term Preterm AB Living  0 0 0 0 0 0  SAB IAB Ectopic Multiple Live Births  0 0 0 0 0    Past medical history,surgical history, problem list, medications, allergies, family history and social history were all reviewed and documented in the EPIC chart.   Directed ROS with pertinent positives and negatives documented in the history of present illness/assessment and plan.  Exam:  Vitals:   07/28/22 1022  BP: 108/64  Pulse: 86  SpO2: 98%   General appearance:  Normal  Abdomen: Normal  Gynecologic exam: Vulva normal.  Speculum:   Assessment/Plan:  47 y.o. G0  1. Irregular periods/menstrual cycles Breakthrough bleeding x last menstrual period.  LMP 07/13/22.  Menses/BTB every 14-30 days in the last year.  No Pelvic pain.  No vaginal discharge. Sexually active, using condoms. Declines STI screen.  UPT today Neg. Normal Gyn exam with moderate dark blood from the EO.  CBC/TSH/Prl pending.  F/U Pelvic US, possible EBx.  Start on the Progestin pill to control the cycle.  Risks/benefits/usage reviewed.  Prescription sent to pharmacy. - Pregnancy, urine - CBC - TSH - Prolactin - US Transvaginal Non-OB; Future  Other orders - norethindrone (MICRONOR) 0.35 MG tablet; Take 1 tablet (0.35 mg total) by mouth daily.   [name], 10:29 AM 07/28/2022

## 2022-07-28 NOTE — Telephone Encounter (Signed)
Order re-entered to be done at an external [facility] location. Pt advised and provided w/ [facility] centralized scheduling phone # to call and make appt at her earliest convenience and advised to make OV w/ ML at least 3 days after to go over results/recommendations from [place]. Pt voiced understanding.

## 2022-07-29 LAB — TSH: TSH: 1.16 mIU/L

## 2022-07-29 LAB — PROLACTIN: Prolactin: 6.3 ng/mL

## 2022-07-29 LAB — CBC
Hemoglobin: 11.7 g/dL (ref 11.7–15.5)
MCHC: 32.4 g/dL (ref 32.0–36.0)
Platelets: 220 10*3/uL (ref 140–400)
RDW: 12.3 % (ref 11.0–15.0)

## 2022-08-01 ENCOUNTER — Other Ambulatory Visit: Payer: Self-pay

## 2022-08-01 ENCOUNTER — Ambulatory Visit (INDEPENDENT_AMBULATORY_CARE_PROVIDER_SITE_OTHER): Payer: BC Managed Care – PPO | Admitting: Obstetrics & Gynecology

## 2022-08-01 ENCOUNTER — Ambulatory Visit: Payer: BC Managed Care – PPO

## 2022-08-01 ENCOUNTER — Encounter: Payer: Self-pay | Admitting: Obstetrics & Gynecology

## 2022-08-01 VITALS — BP 118/76 | HR 85

## 2022-08-01 DIAGNOSIS — N926 Irregular menstruation, unspecified: Secondary | ICD-10-CM

## 2022-08-01 DIAGNOSIS — Z3041 Encounter for surveillance of contraceptive pills: Secondary | ICD-10-CM

## 2022-08-01 NOTE — Telephone Encounter (Signed)
Pt now scheduled for [place] and OV w/ ML today in office. Will cancel order for external [place] and will have someone involved in pt's care during visit to remind her to call and cancel appt for external [place].

## 2022-08-01 NOTE — Progress Notes (Signed)
    Leslie Ford [birth year] [id number]        47 y.o.  G0 Married  RP: Irregular periods for Pelvic US  HPI:  Seen on 07/28/22:  Breakthrough bleeding x last menstrual period.  LMP 07/13/22.  Menses/BTB every 14-30 days in the last year.  No Pelvic pain. No vaginal discharge. Sexually active, using condoms. Declines STI screen.  UPT Neg.  Started on the Progestin only pill on 07/28/22, improved bleeding since started, barely spotting now.   OB History  Gravida Para Term Preterm AB Living  0 0 0 0 0 0  SAB IAB Ectopic Multiple Live Births  0 0 0 0 0    Past medical history,surgical history, problem list, medications, allergies, family history and social history were all reviewed and documented in the EPIC chart.   Directed ROS with pertinent positives and negatives documented in the history of present illness/assessment and plan.  Exam:  Vitals:   08/01/22 1502  BP: 118/76  Pulse: 85   General appearance:  Normal  Pelvic US today: Comparison is made with previous scan January 2023.  T/V images.  Retroverted uterus normal in size and shape with no myometrial mass. The uterus is measured at 8.32 x 5.39 x 4.96 cm.  Thin and symmetrical endometrial lining measured at 3.96 mm, which is thinner than the previous scan. No mass or thickening or abnormal blood flow seen at the endometrium. Both ovaries are small with sparse tiny follicles.  No adnexal mass.  No free fluid in the pelvis.   Assessment/Plan:  47 y.o. G0  1. Irregular periods/menstrual cycles Seen on 07/28/22:  Breakthrough bleeding x last menstrual period.  LMP 07/13/22.  Menses/BTB every 14-30 days in the last year.  No Pelvic pain. No vaginal discharge. Sexually active, using condoms. Declines STI screen.  UPT Neg.  Started on the Progestin only pill on 07/28/22, improved bleeding since started, barely spotting now.  Pelvic US normal with a thin endometrial line at 3.96 mm.  Patient reassured.  Bleeding precautions reviewed.     2. Encounter for surveillance of contraceptive pills  Well on the Progestin pill.  No CI to continue.  [name], 3:13 PM 08/01/2022

## 2022-08-04 ENCOUNTER — Ambulatory Visit (HOSPITAL_COMMUNITY): Payer: BC Managed Care – PPO

## 2022-08-09 ENCOUNTER — Ambulatory Visit: Payer: BC Managed Care – PPO | Admitting: Obstetrics & Gynecology

## 2022-09-04 ENCOUNTER — Encounter: Payer: Self-pay | Admitting: Obstetrics & Gynecology

## 2022-09-12 NOTE — Telephone Encounter (Signed)
External [place] was cancelled by pt. She was seen in office on 08/01/2022 by ML. Will close encounter.

## 2022-11-17 ENCOUNTER — Other Ambulatory Visit: Payer: Self-pay | Admitting: Family Medicine

## 2022-11-17 DIAGNOSIS — N63 Unspecified lump in unspecified breast: Secondary | ICD-10-CM

## 2022-11-24 ENCOUNTER — Other Ambulatory Visit: Payer: BC Managed Care – PPO

## 2023-11-19 ENCOUNTER — Other Ambulatory Visit: Payer: Self-pay | Admitting: Family Medicine

## 2023-11-19 DIAGNOSIS — Z1231 Encounter for screening mammogram for malignant neoplasm of breast: Secondary | ICD-10-CM

## 2023-11-21 ENCOUNTER — Inpatient Hospital Stay
Admission: RE | Admit: 2023-11-21 | Discharge: 2023-11-21 | Discharge status: Home / Self Care | Source: Ambulatory Visit | Attending: Family Medicine | Admitting: Family Medicine

## 2023-11-21 DIAGNOSIS — Z1231 Encounter for screening mammogram for malignant neoplasm of breast: Secondary | ICD-10-CM
